# Patient Record
Sex: Male | Born: 1980 | Race: White | Hispanic: No | Marital: Married | Smoking: Former smoker
Health system: Southern US, Community
[De-identification: ages and names within clinical notes are randomized; demographics above are authoritative.]

## PROBLEM LIST (undated history)

## (undated) DIAGNOSIS — K219 Gastro-esophageal reflux disease without esophagitis: Secondary | ICD-10-CM

## (undated) DIAGNOSIS — F909 Attention-deficit hyperactivity disorder, unspecified type: Secondary | ICD-10-CM

## (undated) DIAGNOSIS — R51 Headache: Secondary | ICD-10-CM

## (undated) DIAGNOSIS — M1990 Unspecified osteoarthritis, unspecified site: Secondary | ICD-10-CM

## (undated) DIAGNOSIS — R519 Headache, unspecified: Secondary | ICD-10-CM

## (undated) DIAGNOSIS — T7840XA Allergy, unspecified, initial encounter: Secondary | ICD-10-CM

## (undated) HISTORY — DX: Headache: R51

## (undated) HISTORY — DX: Attention-deficit hyperactivity disorder, unspecified type: F90.9

## (undated) HISTORY — DX: Allergy, unspecified, initial encounter: T78.40XA

## (undated) HISTORY — DX: Unspecified osteoarthritis, unspecified site: M19.90

## (undated) HISTORY — DX: Headache, unspecified: R51.9

## (undated) HISTORY — DX: Gastro-esophageal reflux disease without esophagitis: K21.9

---

## 2000-05-01 HISTORY — PX: WISDOM TOOTH EXTRACTION: SHX21

## 2001-05-01 HISTORY — PX: COSMETIC SURGERY: SHX468

## 2002-04-29 ENCOUNTER — Ambulatory Visit (HOSPITAL_BASED_OUTPATIENT_CLINIC_OR_DEPARTMENT_OTHER): Admission: RE | Admit: 2002-04-29 | Discharge: 2002-04-29 | Payer: Self-pay | Admitting: Plastic Surgery

## 2017-11-27 ENCOUNTER — Telehealth: Payer: Self-pay | Admitting: General Practice

## 2017-11-27 NOTE — Telephone Encounter (Signed)
Please advise 

## 2017-11-27 NOTE — Telephone Encounter (Signed)
Called pt after receiving a request to establish care with our office. Pt requested to establish with Dr. Drue NovelPaz, after hearing about him through is fiance's family. Would Dr. Drue NovelPaz being willing to take on a new patient?

## 2017-11-27 NOTE — Telephone Encounter (Signed)
Yes, please schedule an appointment, please keep in mind that my schedule is tight in the next few weeks

## 2017-11-28 NOTE — Telephone Encounter (Signed)
Called pt and scheduled a new patient visit for 01/14/18.

## 2018-01-14 ENCOUNTER — Encounter: Payer: Self-pay | Admitting: Internal Medicine

## 2018-01-14 ENCOUNTER — Ambulatory Visit: Payer: BLUE CROSS/BLUE SHIELD | Admitting: Internal Medicine

## 2018-01-14 VITALS — BP 128/74 | HR 84 | Temp 97.6°F | Resp 16 | Ht 72.0 in | Wt 231.2 lb

## 2018-01-14 DIAGNOSIS — Z0000 Encounter for general adult medical examination without abnormal findings: Secondary | ICD-10-CM | POA: Diagnosis not present

## 2018-01-14 NOTE — Progress Notes (Signed)
Pre visit review using our clinic review tool, if applicable. No additional management support is needed unless otherwise documented below in the visit note. 

## 2018-01-14 NOTE — Assessment & Plan Note (Addendum)
-  Td 2010.  Recommend a flu shot -Family history: Father: MI at age 37, DM and  melanoma.  Multiple family members with depression  - Diet and exercise discussed - Discussed self skin checkup.  Avoid excessive sun exposure -Labs: CMP, CBC, TSH, FLP, testosterone level per patient request. - EKG for baseline: NSR, no old EKGs, no acute changes. RTC 1 year

## 2018-01-14 NOTE — Progress Notes (Signed)
Subjective:    Patient ID: Paul Alvarez, male    DOB: Sep 29, 1980, 37 y.o.   MRN: [id number]  DOS:  01/14/2018 Type of visit - description : CPX, new pt, here w/ his fiancee  Interval history: In general feeling well. He is somewhat concerned about his family history  Review of Systems History of allergies, they are seasonal, currently okay. History of migraines, no frequent headaches lately Occasional GERD symptoms. Occasionally decreased libido and difficulty with erections, check testosterone?  Other than above, a 14 point review of systems is negative      Past Medical History:  Diagnosis Date  . Allergy   . Arthritis   . Frequent headaches   . GERD (gastroesophageal reflux disease)     Past Surgical History:  Procedure Laterality Date  . COSMETIC SURGERY  2003   cyst removal  . WISDOM TOOTH EXTRACTION  2002    Social History   Socioeconomic History  . Marital status: Single    Spouse name: Not on file  . Number of children: 0  . Years of education: Not on file  . Highest education level: Not on file  Occupational History  . Occupation: IT /software  Social Needs  . Financial resource strain: Not on file  . Food insecurity:    Worry: Not on file    Inability: Not on file  . Transportation needs:    Medical: Not on file    Non-medical: Not on file  Tobacco Use  . Smoking status: Former Games developermoker  . Smokeless tobacco: Never Used  . Tobacco comment: quit 10/2017, no vaping   Substance and Sexual Activity  . Alcohol use: Yes    Comment: rarely  . Drug use: Not on file  . Sexual activity: Yes  Lifestyle  . Physical activity:    Days per week: Not on file    Minutes per session: Not on file  . Stress: Not on file  Relationships  . Social connections:    Talks on phone: Not on file    Gets together: Not on file    Attends religious service: Not on file    Active member of club or organization: Not on file    Attends meetings of clubs or  organizations: Not on file    Relationship status: Not on file  . Intimate partner violence:    Fear of current or ex partner: Not on file    Emotionally abused: Not on file    Physically abused: Not on file    Forced sexual activity: Not on file  Other Topics Concern  . Not on file  Social History Narrative   Got engaged 12-2017     Family History  Problem Relation Age of Onset  . Arthritis Mother   . Depression Mother   . Hyperlipidemia Mother   . Cancer Father        Melonoma  . Depression Father   . Diabetes Father   . Heart attack Father [id number]  . Heart disease Father   . Hyperlipidemia Father   . Hypertension Father   . Depression Sister   . Hypertension Sister   . Arthritis Maternal Grandmother   . Diabetes Maternal Grandmother   . Hyperlipidemia Maternal Grandmother   . Hypertension Maternal Grandmother   . Alcohol abuse Maternal Grandfather   . Cancer Maternal Grandfather   . Drug abuse Maternal Grandfather   . Early death Maternal Grandfather   . Stroke Paternal Grandmother   .  Early death Paternal Grandfather   . Heart disease Paternal Grandfather   . Heart attack Paternal Grandfather      Allergies as of 01/14/2018   No Known Allergies     Medication List    as of 01/14/2018 11:59 PM   You have not been prescribed any medications.        Objective:   Physical Exam BP 128/74 (BP Location: Left Arm, Patient Position: Sitting, Cuff Size: Normal)   Pulse 84   Temp 97.6 F (36.4 C) (Oral)   Resp 16   Ht 6' (1.829 m)   Wt 231 lb 4 oz (104.9 kg)   SpO2 96%   BMI 31.36 kg/m  General: Well developed, NAD, see BMI.  Neck: No  thyromegaly  HEENT:  Normocephalic . Face symmetric, atraumatic Lungs:  CTA B Normal respiratory effort, no intercostal retractions, no accessory muscle use. Heart: RRR,  no murmur.  No pretibial edema bilaterally  Abdomen:  Not distended, soft, non-tender. No rebound or rigidity.   Skin: Exposed areas without rash. Not  pale. Not jaundice Neurologic:  alert & oriented X3.  Speech normal, gait appropriate for age and unassisted Strength symmetric and appropriate for age.  Psych: Cognition and judgment appear intact.  Cooperative with normal attention span and concentration.  Behavior appropriate. No anxious or depressed appearing.     Assessment & Plan:   Assessment  New patient 12-2017 GERD Migraines Hayfever, allergies.  Plan: New patient. GERD: Usually dependent on his diet. Migraines: Currently controlled, no headaches in few months Allergies: Usually seasonal. The patient's fianc goes to a integrative medicine doctor, they check a number of additional labs and she wonders if I could check on him additional labs,  rec to consult w/ the integrative medicine doctor for that. RTC 1 year

## 2018-01-14 NOTE — Patient Instructions (Addendum)
  GO TO THE FRONT DESK Schedule labs to be done fasting at some point this week. Schedule your next appointment for a physical exam in 1 year     Mole A mole is a colored (pigmented) growth on the skin. Moles are very common. They are usually harmless, but some moles can become cancerous over time. What are the causes? Moles occur when pigmented skin cells grow together in clusters instead of spreading out in the skin as they normally do. The reason why the skin cells grow together in clusters is not known. What are the signs or symptoms? A mole may be:  Manson PasseyBrown or black.  Flat or raised.  Smooth or wrinkled.  How is this diagnosed? A mole is diagnosed with a skin exam. If your health care provider thinks a mole may be cancerous, a piece of the mole will be removed for testing. How is this treated? Treatment is not needed unless a mole is cancerous. If a mole is cancerous, it will be removed. If a mole is causing pain or you do not like the way it looks, you may choose to have it removed. Follow these instructions at home:  Every month, look for new moles and check your existing moles for changes. This is important because a change in a mole can mean that the mole has become cancerous. Look for changes in: ? Size. Look for moles that are more than  in (0.64 cm) wide (in diameter). ? Shape. Look for moles that are not round or oval. ? Borders. Look for moles that are not symmetrical. ? Color. Note that it is normal for moles to get darker during pregnancy or when you take birth control pills.  When you are outdoors, wear sunscreen with SPF 30 (sun protection factor 30) or higher. Reapply the sunscreen every 2-3 hours.  If you have a large number of moles, see a skin doctor (dermatologist) at least one time every year. Contact a health care provider if:  The size, shape, borders, or color of your mole change.  Your mole, or the skin near the mole, becomes painful, sore, red, or  swollen.  Your mole: ? Develops more than one color. ? Itches or bleeds. ? Becomes scaly, sheds skin, or oozes fluid. ? Becomes flat or develops raised areas. ? Becomes hard or soft.  You develop a new mole. This information is not intended to replace advice given to you by your health care provider. Make sure you discuss any questions you have with your health care provider. Document Released: 01/10/2001 Document Revised: 09/29/2015 Document Reviewed: 02/05/2015 Elsevier Interactive Patient Education  [employer]2018 [employer]

## 2018-01-15 ENCOUNTER — Encounter: Payer: Self-pay | Admitting: Internal Medicine

## 2018-01-15 DIAGNOSIS — Z09 Encounter for follow-up examination after completed treatment for conditions other than malignant neoplasm: Secondary | ICD-10-CM | POA: Insufficient documentation

## 2018-01-15 NOTE — Assessment & Plan Note (Signed)
New patient. GERD: Usually dependent on his diet. Migraines: Currently controlled, no headaches in few months Allergies: Usually seasonal. The patient's fianc goes to a integrative medicine doctor, they check a number of additional labs and she wonders if I could check on him additional labs,  rec to consult w/ the integrative medicine doctor for that. RTC 1 year

## 2018-01-18 ENCOUNTER — Other Ambulatory Visit (INDEPENDENT_AMBULATORY_CARE_PROVIDER_SITE_OTHER): Payer: BLUE CROSS/BLUE SHIELD

## 2018-01-18 DIAGNOSIS — Z0000 Encounter for general adult medical examination without abnormal findings: Secondary | ICD-10-CM

## 2018-01-18 LAB — COMPREHENSIVE METABOLIC PANEL
ALT: 21 U/L (ref 0–53)
AST: 16 U/L (ref 0–37)
Albumin: 4.5 g/dL (ref 3.5–5.2)
Alkaline Phosphatase: 81 U/L (ref 39–117)
BILIRUBIN TOTAL: 0.6 mg/dL (ref 0.2–1.2)
BUN: 14 mg/dL (ref 6–23)
CO2: 28 meq/L (ref 19–32)
CREATININE: 0.89 mg/dL (ref 0.40–1.50)
Calcium: 9.6 mg/dL (ref 8.4–10.5)
Chloride: 105 mEq/L (ref 96–112)
GFR: 102.33 mL/min (ref >60.00)
GLUCOSE: 95 mg/dL (ref 70–99)
Potassium: 4.4 mEq/L (ref 3.5–5.1)
SODIUM: 140 meq/L (ref 135–145)
Total Protein: 6.9 g/dL (ref 6.0–8.3)

## 2018-01-18 LAB — CBC WITH DIFFERENTIAL/PLATELET
Basophils Absolute: 0 10*3/uL (ref 0.0–0.1)
Basophils Relative: 0.7 % (ref 0.0–3.0)
EOS PCT: 3.6 % (ref 0.0–5.0)
Eosinophils Absolute: 0.2 10*3/uL (ref 0.0–0.7)
HCT: 44.2 % (ref 39.0–52.0)
Hemoglobin: 14.9 g/dL (ref 13.0–17.0)
LYMPHS PCT: 25.2 % (ref 12.0–46.0)
Lymphs Abs: 1.6 10*3/uL (ref 0.7–4.0)
MCHC: 33.6 g/dL (ref 30.0–36.0)
MCV: 86.7 fl (ref 78.0–100.0)
Monocytes Absolute: 0.7 10*3/uL (ref 0.1–1.0)
Monocytes Relative: 10.1 % (ref 3.0–12.0)
Neutro Abs: 3.9 10*3/uL (ref 1.4–7.7)
Neutrophils Relative %: 60.4 % (ref 43.0–77.0)
PLATELETS: 258 10*3/uL (ref 150.0–400.0)
RBC: 5.1 Mil/uL (ref 4.22–5.81)
RDW: 13.3 % (ref 11.5–15.5)
WBC: 6.4 10*3/uL (ref 4.0–10.5)

## 2018-01-18 LAB — LIPID PANEL
CHOL/HDL RATIO: 6
Cholesterol: 169 mg/dL (ref 0–200)
HDL: 26.8 mg/dL — ABNORMAL LOW (ref >39.00)
LDL Cholesterol: 116 mg/dL — ABNORMAL HIGH (ref 0–99)
NONHDL: 142.29
Triglycerides: 133 mg/dL (ref 0.0–149.0)
VLDL: 26.6 mg/dL (ref 0.0–40.0)

## 2018-01-18 LAB — TSH: TSH: 1.01 u[IU]/mL (ref 0.35–4.50)

## 2018-01-20 LAB — TESTOSTERONE,FREE AND TOTAL
Testosterone, Free: 11.4 pg/mL (ref 8.7–25.1)
Testosterone: 263 ng/dL — ABNORMAL LOW (ref 264–916)

## 2018-02-11 DIAGNOSIS — M47812 Spondylosis without myelopathy or radiculopathy, cervical region: Secondary | ICD-10-CM | POA: Diagnosis not present

## 2018-02-11 DIAGNOSIS — M542 Cervicalgia: Secondary | ICD-10-CM | POA: Diagnosis not present

## 2018-02-11 DIAGNOSIS — M9902 Segmental and somatic dysfunction of thoracic region: Secondary | ICD-10-CM | POA: Diagnosis not present

## 2018-02-11 DIAGNOSIS — M9901 Segmental and somatic dysfunction of cervical region: Secondary | ICD-10-CM | POA: Diagnosis not present

## 2018-02-14 DIAGNOSIS — M542 Cervicalgia: Secondary | ICD-10-CM | POA: Diagnosis not present

## 2018-02-14 DIAGNOSIS — M9902 Segmental and somatic dysfunction of thoracic region: Secondary | ICD-10-CM | POA: Diagnosis not present

## 2018-02-14 DIAGNOSIS — M9901 Segmental and somatic dysfunction of cervical region: Secondary | ICD-10-CM | POA: Diagnosis not present

## 2018-02-14 DIAGNOSIS — M47812 Spondylosis without myelopathy or radiculopathy, cervical region: Secondary | ICD-10-CM | POA: Diagnosis not present

## 2018-02-18 DIAGNOSIS — M542 Cervicalgia: Secondary | ICD-10-CM | POA: Diagnosis not present

## 2018-02-18 DIAGNOSIS — M9902 Segmental and somatic dysfunction of thoracic region: Secondary | ICD-10-CM | POA: Diagnosis not present

## 2018-02-18 DIAGNOSIS — M9901 Segmental and somatic dysfunction of cervical region: Secondary | ICD-10-CM | POA: Diagnosis not present

## 2018-02-18 DIAGNOSIS — M47812 Spondylosis without myelopathy or radiculopathy, cervical region: Secondary | ICD-10-CM | POA: Diagnosis not present

## 2018-02-21 DIAGNOSIS — M47812 Spondylosis without myelopathy or radiculopathy, cervical region: Secondary | ICD-10-CM | POA: Diagnosis not present

## 2018-02-21 DIAGNOSIS — M9901 Segmental and somatic dysfunction of cervical region: Secondary | ICD-10-CM | POA: Diagnosis not present

## 2018-02-21 DIAGNOSIS — M9902 Segmental and somatic dysfunction of thoracic region: Secondary | ICD-10-CM | POA: Diagnosis not present

## 2018-02-21 DIAGNOSIS — M542 Cervicalgia: Secondary | ICD-10-CM | POA: Diagnosis not present

## 2018-03-07 DIAGNOSIS — M9902 Segmental and somatic dysfunction of thoracic region: Secondary | ICD-10-CM | POA: Diagnosis not present

## 2018-03-07 DIAGNOSIS — M47812 Spondylosis without myelopathy or radiculopathy, cervical region: Secondary | ICD-10-CM | POA: Diagnosis not present

## 2018-03-07 DIAGNOSIS — M542 Cervicalgia: Secondary | ICD-10-CM | POA: Diagnosis not present

## 2018-03-07 DIAGNOSIS — M9901 Segmental and somatic dysfunction of cervical region: Secondary | ICD-10-CM | POA: Diagnosis not present

## 2018-03-21 DIAGNOSIS — M9901 Segmental and somatic dysfunction of cervical region: Secondary | ICD-10-CM | POA: Diagnosis not present

## 2018-03-21 DIAGNOSIS — M542 Cervicalgia: Secondary | ICD-10-CM | POA: Diagnosis not present

## 2018-03-21 DIAGNOSIS — M9902 Segmental and somatic dysfunction of thoracic region: Secondary | ICD-10-CM | POA: Diagnosis not present

## 2018-03-21 DIAGNOSIS — M47812 Spondylosis without myelopathy or radiculopathy, cervical region: Secondary | ICD-10-CM | POA: Diagnosis not present

## 2018-03-22 ENCOUNTER — Encounter: Payer: Self-pay | Admitting: Internal Medicine

## 2018-03-22 ENCOUNTER — Ambulatory Visit: Payer: BLUE CROSS/BLUE SHIELD | Admitting: Internal Medicine

## 2018-03-22 VITALS — BP 126/70 | HR 84 | Temp 98.3°F | Resp 16 | Ht 72.0 in | Wt 232.5 lb

## 2018-03-22 DIAGNOSIS — J069 Acute upper respiratory infection, unspecified: Secondary | ICD-10-CM

## 2018-03-22 NOTE — Progress Notes (Signed)
Subjective:    Patient ID: Paul Alvarez, male    DOB: 16-Sep-1980, 37 y.o.   MRN: [id number]  DOS:  03/22/2018 Type of visit - description : acute  Symptoms are started 5 days ago: Dry cough, mild postnasal dripping. Taking Delsym, only mild results. Few days ago, there was a leak at his house and he went to the attic, was exposed to a small amount of insulation dust.  Wonders if that is the culprit.  Review of Systems Had low-grade temperature, T-max 99.5.  No chills No sore throat No other people sick at home. Occasional wheezing?.  No rash No sneezing No itchy eyes or nose.  Past Medical History:  Diagnosis Date  . Allergy   . Arthritis   . Frequent headaches   . GERD (gastroesophageal reflux disease)     Past Surgical History:  Procedure Laterality Date  . COSMETIC SURGERY  2003   cyst removal  . WISDOM TOOTH EXTRACTION  2002    Social History   Socioeconomic History  . Marital status: Single    Spouse name: Not on file  . Number of children: 0  . Years of education: Not on file  . Highest education level: Not on file  Occupational History  . Occupation: IT /software  Social Needs  . Financial resource strain: Not on file  . Food insecurity:    Worry: Not on file    Inability: Not on file  . Transportation needs:    Medical: Not on file    Non-medical: Not on file  Tobacco Use  . Smoking status: Former Games developermoker  . Smokeless tobacco: Never Used  . Tobacco comment: quit 10/2017, no vaping   Substance and Sexual Activity  . Alcohol use: Yes    Comment: rarely  . Drug use: Not on file  . Sexual activity: Yes  Lifestyle  . Physical activity:    Days per week: Not on file    Minutes per session: Not on file  . Stress: Not on file  Relationships  . Social connections:    Talks on phone: Not on file    Gets together: Not on file    Attends religious service: Not on file    Active member of club or organization: Not on file    Attends  meetings of clubs or organizations: Not on file    Relationship status: Not on file  . Intimate partner violence:    Fear of current or ex partner: Not on file    Emotionally abused: Not on file    Physically abused: Not on file    Forced sexual activity: Not on file  Other Topics Concern  . Not on file  Social History Narrative   Got engaged 12-2017      Allergies as of 03/22/2018   No Known Allergies     Medication List    as of 03/22/2018 10:53 AM   You have not been prescribed any medications.         Objective:   Physical Exam BP 126/70 (BP Location: Left Arm, Patient Position: Sitting, Cuff Size: Normal)   Pulse 84   Temp 98.3 F (36.8 C) (Oral)   Resp 16   Ht 6' (1.829 m)   Wt 232 lb 8 oz (105.5 kg)   SpO2 98%   BMI 31.53 kg/m   General:   Well developed, NAD, BMI noted. HEENT:  Normocephalic . Face symmetric, atraumatic.  TMs are slightly bulged but not  red.  Nose congested, sinuses no TTP.  Throat symmetric and not red Lungs:  Very few rhonchi with cough only.  No wheezing. Normal respiratory effort, no intercostal retractions, no accessory muscle use. Heart: RRR,  no murmur.  No pretibial edema bilaterally  Skin: Not pale. Not jaundice Neurologic:  alert & oriented X3.  Speech normal, gait appropriate for age and unassisted Psych--  Cognition and judgment appear intact.  Cooperative with normal attention span and concentration.  Behavior appropriate. No anxious or depressed appearing.      Assessment & Plan:    Assessment  New patient 12-2017 GERD Migraines Hay fever, allergies.  Plan: URI Upper respiratory symptoms most likely due to a URI.  Recommend supportive treatment, see AVS.  Call if no better

## 2018-03-22 NOTE — Patient Instructions (Signed)
Rest, fluids , tylenol  For cough:  Take Mucinex DM twice a day as needed until better  For nasal congestion: Use   Flonase : 2 nasal sprays on each side of the nose in the morning until you feel better   Take OTC Claritin  Call if not gradually better over the next  10 days  Call anytime if the symptoms are severe

## 2018-03-22 NOTE — Progress Notes (Signed)
Pre visit review using our clinic review tool, if applicable. No additional management support is needed unless otherwise documented below in the visit note. 

## 2018-03-23 NOTE — Assessment & Plan Note (Signed)
URI Upper respiratory symptoms most likely due to a URI.  Recommend supportive treatment, see AVS.  Call if no better

## 2018-04-04 DIAGNOSIS — M9902 Segmental and somatic dysfunction of thoracic region: Secondary | ICD-10-CM | POA: Diagnosis not present

## 2018-04-04 DIAGNOSIS — M542 Cervicalgia: Secondary | ICD-10-CM | POA: Diagnosis not present

## 2018-04-04 DIAGNOSIS — M47812 Spondylosis without myelopathy or radiculopathy, cervical region: Secondary | ICD-10-CM | POA: Diagnosis not present

## 2018-04-04 DIAGNOSIS — M9901 Segmental and somatic dysfunction of cervical region: Secondary | ICD-10-CM | POA: Diagnosis not present

## 2018-04-17 DIAGNOSIS — M9901 Segmental and somatic dysfunction of cervical region: Secondary | ICD-10-CM | POA: Diagnosis not present

## 2018-04-17 DIAGNOSIS — M9902 Segmental and somatic dysfunction of thoracic region: Secondary | ICD-10-CM | POA: Diagnosis not present

## 2018-04-17 DIAGNOSIS — M47812 Spondylosis without myelopathy or radiculopathy, cervical region: Secondary | ICD-10-CM | POA: Diagnosis not present

## 2018-04-17 DIAGNOSIS — M542 Cervicalgia: Secondary | ICD-10-CM | POA: Diagnosis not present

## 2019-01-16 ENCOUNTER — Encounter: Payer: BLUE CROSS/BLUE SHIELD | Admitting: Internal Medicine

## 2019-02-11 ENCOUNTER — Encounter: Payer: BLUE CROSS/BLUE SHIELD | Admitting: Internal Medicine

## 2019-03-25 ENCOUNTER — Encounter: Payer: BLUE CROSS/BLUE SHIELD | Admitting: Internal Medicine

## 2020-07-01 ENCOUNTER — Encounter: Payer: Self-pay | Admitting: Internal Medicine

## 2020-08-20 ENCOUNTER — Encounter: Payer: Self-pay | Admitting: Internal Medicine

## 2020-10-08 ENCOUNTER — Encounter: Payer: Self-pay | Admitting: Internal Medicine

## 2020-10-08 ENCOUNTER — Ambulatory Visit (INDEPENDENT_AMBULATORY_CARE_PROVIDER_SITE_OTHER): Payer: PRIVATE HEALTH INSURANCE | Admitting: Internal Medicine

## 2020-10-08 ENCOUNTER — Other Ambulatory Visit: Payer: Self-pay

## 2020-10-08 VITALS — BP 124/86 | HR 87 | Temp 98.3°F | Resp 16 | Ht 72.0 in | Wt 255.0 lb

## 2020-10-08 DIAGNOSIS — Z0000 Encounter for general adult medical examination without abnormal findings: Secondary | ICD-10-CM | POA: Diagnosis not present

## 2020-10-08 DIAGNOSIS — Z114 Encounter for screening for human immunodeficiency virus [HIV]: Secondary | ICD-10-CM | POA: Diagnosis not present

## 2020-10-08 DIAGNOSIS — Z23 Encounter for immunization: Secondary | ICD-10-CM | POA: Diagnosis not present

## 2020-10-08 DIAGNOSIS — F32A Depression, unspecified: Secondary | ICD-10-CM | POA: Diagnosis not present

## 2020-10-08 DIAGNOSIS — Z1159 Encounter for screening for other viral diseases: Secondary | ICD-10-CM

## 2020-10-08 DIAGNOSIS — R5383 Other fatigue: Secondary | ICD-10-CM

## 2020-10-08 NOTE — Patient Instructions (Signed)
  GO TO THE LAB : Get the blood work     GO TO THE FRONT DESK, PLEASE SCHEDULE YOUR APPOINTMENTS Come back for a checkup in 4 months 

## 2020-10-08 NOTE — Progress Notes (Signed)
Subjective:    Patient ID: Paul Alvarez, male    DOB: 08-Jun-1980, 40 y.o.   MRN: [id number]  DOS:  10/08/2020 Type of visit - description: CPX  Here for CPX Has not been seen in 3 years. Since then he got married, has 2 stepchildren, changed jobs.  Admit to stress. Has noted significant weight gain. Due to stress is feeling anxious and somewhat depressed.  Denies suicidal or homicidal ideas. 6 weeks ago started to talk with a therapist and that helped.  He also reports some decreased energy compared to a couple of years ago, nothing severe.. When asked, reports that he snores mildly for years.  Wt Readings from Last 3 Encounters:  10/08/20 255 lb (115.7 kg)  03/22/18 232 lb 8 oz (105.5 kg)  01/14/18 231 lb 4 oz (104.9 kg)     Review of Systems  Other than above, a 14 point review of systems is negative     Past Medical History:  Diagnosis Date   Allergy    Arthritis    Frequent headaches    GERD (gastroesophageal reflux disease)     Past Surgical History:  Procedure Laterality Date   COSMETIC SURGERY  2003   cyst removal   WISDOM TOOTH EXTRACTION  2002   Social History   Socioeconomic History   Marital status: Married    Spouse name: Not on file   Number of children: 0   Years of education: Not on file   Highest education level: Not on file  Occupational History   Occupation: commercial health insurance  Tobacco Use   Smoking status: Former    Pack years: 0.00   Smokeless tobacco: Never   Tobacco comments:    quit 10/2017, no vaping   Substance and Sexual Activity   Alcohol use: Yes    Comment: rarely   Drug use: Not on file   Sexual activity: Yes  Other Topics Concern   Not on file  Social History Narrative   Married 2019   2 step children   Social Determinants of Health   Financial Resource Strain: Not on file  Food Insecurity: Not on file  Transportation Needs: Not on file  Physical Activity: Not on file  Stress: Not on  file  Social Connections: Not on file  Intimate Partner Violence: Not on file    Allergies as of 10/08/2020   No Known Allergies      Medication List    as of October 08, 2020 11:59 PM   You have not been prescribed any medications.        Objective:   Physical Exam BP 124/86 (BP Location: Left Arm, Patient Position: Sitting, Cuff Size: Normal)   Pulse 87   Temp 98.3 F (36.8 C) (Oral)   Resp 16   Ht 6' (1.829 m)   Wt 255 lb (115.7 kg)   SpO2 97%   BMI 34.58 kg/m  General: Well developed, NAD, BMI noted Neck: No  thyromegaly  HEENT:  Normocephalic . Face symmetric, atraumatic Lungs:  CTA B Normal respiratory effort, no intercostal retractions, no accessory muscle use. Heart: RRR,  no murmur.  Abdomen:  Not distended, soft, non-tender. No rebound or rigidity.   Lower extremities: Trace pretibial edema bilaterally  Skin: Exposed areas without rash. Not pale. Not jaundice Neurologic:  alert & oriented X3.  Speech normal, gait appropriate for age and unassisted Strength symmetric and appropriate for age.  Psych: Cognition and judgment appear intact.  Cooperative with  normal attention span and concentration.  Behavior appropriate. No anxious or depressed appearing.     Assessment     Assessment  New patient 12-2017 GERD Migraines Hay fever, allergies. Covid infex 10/2019, rx conservatively     PLAN Here for CPX Anxiety depression: Due to stress, family and work-related, started to see a therapist recently that is helpful. PHQ-9 today: 9. D/w pt medicines, he is not ready, will call if needed. Weight gain: Extensive discussion about diet and exercise Fatigue: Epworth scale 7, average.  Reassess in 4 months.  Checking labs. RTC 4 months     This visit occurred during the SARS-CoV-2 public health emergency.  Safety protocols were in place, including screening questions prior to the visit, additional usage of staff PPE, and extensive cleaning of exam room  while observing appropriate contact time as indicated for disinfecting solutions.

## 2020-10-09 LAB — COMPREHENSIVE METABOLIC PANEL
ALT: 28 IU/L (ref 0–44)
AST: 23 IU/L (ref 0–40)
Albumin/Globulin Ratio: 2 (ref 1.2–2.2)
Albumin: 5.1 g/dL — ABNORMAL HIGH (ref 4.0–5.0)
Alkaline Phosphatase: 102 IU/L (ref 44–121)
BUN/Creatinine Ratio: 14 (ref 9–20)
BUN: 14 mg/dL (ref 6–20)
Bilirubin Total: 0.4 mg/dL (ref 0.0–1.2)
CO2: 25 mmol/L (ref 20–29)
Calcium: 10 mg/dL (ref 8.7–10.2)
Chloride: 101 mmol/L (ref 96–106)
Creatinine, Ser: 0.99 mg/dL (ref 0.76–1.27)
Globulin, Total: 2.6 g/dL (ref 1.5–4.5)
Glucose: 88 mg/dL (ref 65–99)
Potassium: 4.4 mmol/L (ref 3.5–5.2)
Sodium: 141 mmol/L (ref 134–144)
Total Protein: 7.7 g/dL (ref 6.0–8.5)
eGFR: 74 mL/min/{1.73_m2} (ref >59)

## 2020-10-09 LAB — LIPID PANEL
Chol/HDL Ratio: 5.2 ratio — ABNORMAL HIGH (ref 0.0–5.0)
Cholesterol, Total: 194 mg/dL (ref 100–199)
HDL: 37 mg/dL — ABNORMAL LOW (ref >39)
LDL Chol Calc (NIH): 128 mg/dL — ABNORMAL HIGH (ref 0–99)
Triglycerides: 159 mg/dL — ABNORMAL HIGH (ref 0–149)
VLDL Cholesterol Cal: 29 mg/dL (ref 5–40)

## 2020-10-09 LAB — HIV ANTIBODY (ROUTINE TESTING W REFLEX): HIV Screen 4th Generation wRfx: NONREACTIVE

## 2020-10-09 LAB — CBC WITH DIFFERENTIAL/PLATELET
Basophils Absolute: 0.1 10*3/uL (ref 0.0–0.2)
Basos: 1 %
EOS (ABSOLUTE): 0.2 10*3/uL (ref 0.0–0.4)
Eos: 3 %
Hematocrit: 47.1 % (ref 37.5–51.0)
Hemoglobin: 15.6 g/dL (ref 13.0–17.7)
Immature Grans (Abs): 0.1 10*3/uL (ref 0.0–0.1)
Immature Granulocytes: 1 %
Lymphocytes Absolute: 2 10*3/uL (ref 0.7–3.1)
Lymphs: 21 %
MCH: 28.9 pg (ref 26.6–33.0)
MCHC: 33.1 g/dL (ref 31.5–35.7)
MCV: 87 fL (ref 79–97)
Monocytes Absolute: 0.9 10*3/uL (ref 0.1–0.9)
Monocytes: 9 %
Neutrophils Absolute: 6.3 10*3/uL (ref 1.4–7.0)
Neutrophils: 65 %
Platelets: 276 10*3/uL (ref 150–450)
RBC: 5.4 x10E6/uL (ref 4.14–5.80)
RDW: 13.1 % (ref 11.6–15.4)
WBC: 9.6 10*3/uL (ref 3.4–10.8)

## 2020-10-09 LAB — HEPATITIS C ANTIBODY: Hep C Virus Ab: <0.1 s/co ratio (ref 0.0–0.9)

## 2020-10-09 LAB — HEMOGLOBIN A1C
Est. average glucose Bld gHb Est-mCnc: 114 mg/dL
Hgb A1c MFr Bld: 5.6 % (ref 4.8–5.6)

## 2020-10-09 LAB — B12 AND FOLATE PANEL
Folate: 14.8 ng/mL (ref >3.0)
Vitamin B-12: 448 pg/mL (ref 232–1245)

## 2020-10-09 LAB — TSH: TSH: 1.34 u[IU]/mL (ref 0.450–4.500)

## 2020-10-09 LAB — VITAMIN D 25 HYDROXY (VIT D DEFICIENCY, FRACTURES): Vit D, 25-Hydroxy: 34.5 ng/mL (ref 30.0–100.0)

## 2020-10-10 ENCOUNTER — Encounter: Payer: Self-pay | Admitting: Internal Medicine

## 2020-10-10 NOTE — Assessment & Plan Note (Signed)
-  Tdap: Today -COVID vaccines pro-cons d/w pt, declines  - FH   F  MI at age 40, DM and  melanoma.  Multiple family members with depression - Diet and exercise : Extensive discussion -Labs: HIV, hep C, CMP, FLP, CBC, TSH, A1c, vitamin D B12

## 2020-10-10 NOTE — Assessment & Plan Note (Signed)
Here for CPX Anxiety depression: Due to stress, family and work-related, started to see a therapist recently that is helpful. PHQ-9 today: 9. D/w pt medicines, he is not ready, will call if needed. Weight gain: Extensive discussion about diet and exercise Fatigue: Epworth scale 7, average.  Reassess in 4 months.  Checking labs. RTC 4 months

## 2020-12-30 ENCOUNTER — Other Ambulatory Visit: Payer: Self-pay

## 2020-12-30 ENCOUNTER — Ambulatory Visit (INDEPENDENT_AMBULATORY_CARE_PROVIDER_SITE_OTHER): Payer: PRIVATE HEALTH INSURANCE | Admitting: Internal Medicine

## 2020-12-30 ENCOUNTER — Encounter: Payer: Self-pay | Admitting: Internal Medicine

## 2020-12-30 VITALS — BP 122/80 | HR 79 | Temp 98.1°F | Resp 16 | Ht 72.0 in | Wt 255.5 lb

## 2020-12-30 DIAGNOSIS — F419 Anxiety disorder, unspecified: Secondary | ICD-10-CM | POA: Diagnosis not present

## 2020-12-30 DIAGNOSIS — F32A Depression, unspecified: Secondary | ICD-10-CM

## 2020-12-30 MED ORDER — FLUOXETINE HCL 10 MG PO CAPS: ORAL_CAPSULE | ORAL | 3 refills | Status: DC

## 2020-12-30 NOTE — Progress Notes (Signed)
   Subjective:    Patient ID: Paul Alvarez, male    DOB: 20-Aug-1980, 40 y.o.   MRN: [id number]  DOS:  12/30/2020 Type of visit - description: Follow-up    Review of Systems See above   Past Medical History:  Diagnosis Date   Allergy    Arthritis    Frequent headaches    GERD (gastroesophageal reflux disease)     Past Surgical History:  Procedure Laterality Date   COSMETIC SURGERY  2003   cyst removal   WISDOM TOOTH EXTRACTION  2002    Allergies as of 12/30/2020   No Known Allergies      Medication List    as of December 30, 2020  8:52 AM   You have not been prescribed any medications.        Objective:   Physical Exam BP 122/80 (BP Location: Left Arm, Patient Position: Sitting, Cuff Size: Normal)   Pulse 79   Temp 98.1 F (36.7 C) (Oral)   Resp 16   Ht 6' (1.829 m)   Wt 255 lb 8 oz (115.9 kg)   SpO2 98%   BMI 34.65 kg/m      Assessment     Assessment  New patient 12-2017 GERD Migraines Hay fever, allergies. Covid infex 10/2019, rx conservatively     PLAN Here for CPX Anxiety depression: Due to stress, family and work-related, started to see a therapist recently that is helpful. PHQ-9 today: 9. D/w pt medicines, he is not ready, will call if needed. Weight gain: Extensive discussion about diet and exercise Fatigue: Epworth scale 7, average.  Reassess in 4 months.  Checking labs. RTC 4 months    This visit occurred during the SARS-CoV-2 public health emergency.  Safety protocols were in place, including screening questions prior to the visit, additional usage of staff PPE, and extensive cleaning of exam room while observing appropriate contact time as indicated for disinfecting solutions.

## 2020-12-30 NOTE — Progress Notes (Signed)
   Subjective:    Patient ID: Paul Alvarez, male    DOB: [birth year], 40 y.o.   MRN: [id number]  DOS:  12/30/2020 Type of visit - description: Follow-up The patient scheduled this early follow-up at the request of his wife. She has noted that he is under a lot of stress, his mood varies throughout the day. The patient agrees, he has feel very anxious and that has created some degree of depression. Denies any suicidal ideas. He has also noted decreased capacity on to concentrate on things.  His job is very busy.  He is sleeps well most of the time. Has been told he occasionally snores. Denies any morning headaches. Never formally diagnosed with ADD but at some point he felt he did have ADD   Review of Systems See above   Past Medical History:  Diagnosis Date   Allergy    Arthritis    Frequent headaches    GERD (gastroesophageal reflux disease)     Past Surgical History:  Procedure Laterality Date   COSMETIC SURGERY  2003   cyst removal   WISDOM TOOTH EXTRACTION  2002    Allergies as of 12/30/2020   No Known Allergies      Medication List    as of December 30, 2020  9:01 AM   You have not been prescribed any medications.        Objective:   Physical Exam BP 122/80 (BP Location: Left Arm, Patient Position: Sitting, Cuff Size: Normal)   Pulse 79   Temp 98.1 F (36.7 C) (Oral)   Resp 16   Ht 6' (1.829 m)   Wt 255 lb 8 oz (115.9 kg)   SpO2 98%   BMI 34.65 kg/m  General:   Well developed, NAD, BMI noted. HEENT:  Normocephalic . Face symmetric, atraumatic  Skin: Not pale. Not jaundice Neurologic:  alert & oriented X3.  Speech normal, gait appropriate for age and unassisted Psych--  Cognition and judgment appear intact.  Cooperative with normal attention span and concentration.  Behavior appropriate. No anxious or depressed appearing.      Assessment     Assessment  New patient 12-2017 GERD Migraines Hay fever, allergies. Covid infex  10/2019, rx conservatively   PLAN Anxiety, depression: A lot of stress, work-related.  This is increasing his anxiety and consequently somewhat depressed.  PHQ-9 today 16 (previously 9) His capacity to concentrate has decreased, never formally diagnosed with ADD. I inquired about OSA symptoms, Epworth 11 (elevated)  Plan: Sees a counselor, rec to continue Start fluoxetine 20 mg, reassess in 6 weeks, increase to 40?Marland Kitchen Reassess ADD symptoms after anxiety and depression controlled OSA? Some evidence of OSA (which could mimic ADD type of sxs and worsen anxiety)  We discussed observation for now versus further testing, we agreed to revisit the issue when he comes back in 6 weeks. RTC 6 weeks  This visit occurred during the SARS-CoV-2 public health emergency.  Safety protocols were in place, including screening questions prior to the visit, additional usage of staff PPE, and extensive cleaning of exam room while observing appropriate contact time as indicated for disinfecting solutions.

## 2020-12-30 NOTE — Assessment & Plan Note (Addendum)
Anxiety, depression: A lot of stress, work-related.  This is increasing his anxiety and consequently somewhat depressed.  PHQ-9 today 16 (previously 9) His capacity to concentrate has decreased, never formally diagnosed with ADD. I inquired about OSA symptoms, Epworth 11 (elevated)  Plan: Sees a counselor, rec to continue Start fluoxetine 20 mg, reassess in 6 weeks, increase to 40?Marland Kitchen Reassess ADD symptoms after anxiety and depression controlled OSA? Some evidence of OSA (which could mimic ADD type of sxs and worsen anxiety)  We discussed observation for now versus further testing, we agreed to revisit the issue when he comes back in 6 weeks. RTC 6 weeks

## 2020-12-30 NOTE — Patient Instructions (Signed)
Continue with your therapist  Start fluoxetine 10 mg capsule: 1 a day for 10 days, then 2 tablets daily.  Exercise regularly  Schedule a visit in 6 weeks.

## 2021-02-11 ENCOUNTER — Ambulatory Visit: Payer: PRIVATE HEALTH INSURANCE | Admitting: Internal Medicine

## 2021-02-18 ENCOUNTER — Other Ambulatory Visit: Payer: Self-pay

## 2021-02-18 ENCOUNTER — Encounter: Payer: Self-pay | Admitting: Internal Medicine

## 2021-02-18 ENCOUNTER — Ambulatory Visit (INDEPENDENT_AMBULATORY_CARE_PROVIDER_SITE_OTHER): Payer: PRIVATE HEALTH INSURANCE | Admitting: Internal Medicine

## 2021-02-18 VITALS — BP 122/84 | HR 88 | Temp 98.1°F | Resp 16 | Ht 72.0 in | Wt 255.5 lb

## 2021-02-18 DIAGNOSIS — F32A Depression, unspecified: Secondary | ICD-10-CM

## 2021-02-18 DIAGNOSIS — F419 Anxiety disorder, unspecified: Secondary | ICD-10-CM

## 2021-02-18 MED ORDER — FLUOXETINE HCL 20 MG PO TABS: 20.0000 mg | ORAL_TABLET | Freq: Every day | ORAL | 6 refills | Status: DC

## 2021-02-18 NOTE — Progress Notes (Signed)
   Subjective:    Patient ID: Paul Alvarez, male    DOB: 12-24-1980, 40 y.o.   MRN: [id number]  DOS:  02/18/2021 Type of visit - description: Follow-up  Today with talk about anxiety, depression, possible ADD and OSA. Started fluoxetine at the last visit today in the last 2 to 3 weeks he has definitely seen an improvement.  He is much less sleepy, only has occasional snoring, no suicidal ideas  Review of Systems See above   Past Medical History:  Diagnosis Date   Allergy    Arthritis    Frequent headaches    GERD (gastroesophageal reflux disease)     Past Surgical History:  Procedure Laterality Date   COSMETIC SURGERY  2003   cyst removal   WISDOM TOOTH EXTRACTION  2002    Allergies as of 02/18/2021   No Known Allergies      Medication List        Accurate as of February 18, 2021 11:59 PM. If you have any questions, ask your nurse or doctor.          STOP taking these medications    FLUoxetine 10 MG capsule Commonly known as: PROZAC Replaced by: FLUoxetine 20 MG tablet Stopped by: Willow Ora, MD       TAKE these medications    FLUoxetine 20 MG tablet Commonly known as: PROZAC Take 1 tablet (20 mg total) by mouth daily. Replaces: FLUoxetine 10 MG capsule Started by: Willow Ora, MD           Objective:   Physical Exam BP 122/84 (BP Location: Left Arm, Patient Position: Sitting, Cuff Size: Normal)   Pulse 88   Temp 98.1 F (36.7 C) (Oral)   Resp 16   Ht 6' (1.829 m)   Wt 255 lb 8 oz (115.9 kg)   SpO2 98%   BMI 34.65 kg/m  General:   Well developed, NAD, BMI noted. HEENT:  Normocephalic . Face symmetric, atraumatic Skin: Not pale. Not jaundice Neurologic:  alert & oriented X3.  Speech normal, gait appropriate for age and unassisted Psych--  Cognition and judgment appear intact.  Cooperative with normal attention span and concentration.  Behavior appropriate. No anxious or depressed appearing.      Assessment        Assessment  New patient 12-2017 GERD Migraines Hay fever, allergies. Covid infex 10/2019, rx conservatively   PLAN Anxiety, depression: Since the last visit, started fluoxetine, symptoms definitely improved, no suicidal ideas. Had some symptoms of ADD, states his work environment  is busy and chaotic, he still feels distracted at times but overall things are better.  He does not like to try any ADD medication at this point.  Advised that if his therapist formally dx ADD I would be willing to treat w/ meds. OSA?  Denies feeling sleepy, previous Epworth scale was 11, today:4.  Better, we will continue monitoring. Preventive care: Declined flu shot. RTC 6 months   This visit occurred during the SARS-CoV-2 public health emergency.  Safety protocols were in place, including screening questions prior to the visit, additional usage of staff PPE, and extensive cleaning of exam room while observing appropriate contact time as indicated for disinfecting solutions.

## 2021-02-18 NOTE — Patient Instructions (Signed)
   GO TO THE FRONT DESK, PLEASE SCHEDULE YOUR APPOINTMENTS Come back for  a check up in 6 months  

## 2021-02-20 NOTE — Assessment & Plan Note (Signed)
Anxiety, depression: Since the last visit, started fluoxetine, symptoms definitely improved, no suicidal ideas. Had some symptoms of ADD, states his work environment  is busy and chaotic, he still feels distracted at times but overall things are better.  He does not like to try any ADD medication at this point.  Advised that if his therapist formally dx ADD I would be willing to treat w/ meds. OSA?  Denies feeling sleepy, previous Epworth scale was 11, today:4.  Better, we will continue monitoring. Preventive care: Declined flu shot. RTC 6 months

## 2021-05-27 ENCOUNTER — Other Ambulatory Visit: Payer: Self-pay

## 2021-05-27 ENCOUNTER — Emergency Department (HOSPITAL_BASED_OUTPATIENT_CLINIC_OR_DEPARTMENT_OTHER): Payer: PRIVATE HEALTH INSURANCE

## 2021-05-27 ENCOUNTER — Emergency Department (HOSPITAL_BASED_OUTPATIENT_CLINIC_OR_DEPARTMENT_OTHER)
Admission: EM | Admit: 2021-05-27 | Discharge: 2021-05-27 | Disposition: A | Discharge status: Home / Self Care | Payer: PRIVATE HEALTH INSURANCE | Attending: Emergency Medicine | Admitting: Emergency Medicine

## 2021-05-27 ENCOUNTER — Encounter (HOSPITAL_BASED_OUTPATIENT_CLINIC_OR_DEPARTMENT_OTHER): Payer: Self-pay | Admitting: Emergency Medicine

## 2021-05-27 DIAGNOSIS — M25562 Pain in left knee: Secondary | ICD-10-CM | POA: Insufficient documentation

## 2021-05-27 DIAGNOSIS — S0081XA Abrasion of other part of head, initial encounter: Secondary | ICD-10-CM | POA: Insufficient documentation

## 2021-05-27 DIAGNOSIS — S0990XA Unspecified injury of head, initial encounter: Secondary | ICD-10-CM | POA: Diagnosis present

## 2021-05-27 DIAGNOSIS — Z79899 Other long term (current) drug therapy: Secondary | ICD-10-CM | POA: Insufficient documentation

## 2021-05-27 DIAGNOSIS — M436 Torticollis: Secondary | ICD-10-CM

## 2021-05-27 DIAGNOSIS — M25619 Stiffness of unspecified shoulder, not elsewhere classified: Secondary | ICD-10-CM | POA: Insufficient documentation

## 2021-05-27 DIAGNOSIS — Y92410 Unspecified street and highway as the place of occurrence of the external cause: Secondary | ICD-10-CM | POA: Diagnosis not present

## 2021-05-27 MED ORDER — METHOCARBAMOL 500 MG PO TABS: 500.0000 mg | ORAL_TABLET | Freq: Every evening | ORAL | 0 refills | Status: DC | PRN
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
## 2021-05-27 NOTE — Discharge Instructions (Addendum)
Take ibuprofen 3 times a day with meals.  Do not take other anti-inflammatories at the same time (Advil, Motrin, naproxen, Aleve). You may supplement with Tylenol if you need further pain control. °Use robaxin as needed for muscle stiffness or soreness.  Have caution, this may make you tired or groggy.  Do not drive or operate heavy machinery while taking this medicine. °Use ice packs or heating pads if this helps control your pain. °You will likely have continued muscle stiffness and soreness over the next couple days.  Follow-up with primary care in 1 week if your symptoms are not improving. °Return to the emergency room if you develop vision changes, vomiting, slurred speech, numbness, loss of bowel or bladder control, or any new or worsening symptoms. ° °

## 2021-05-27 NOTE — ED Notes (Signed)
Patient discharged to home.  All discharge instructions reviewed.  Patient verbalized understanding via teachback method.  VS WDL.  Respirations even and unlabored.  Ambulatory out of ED.   °

## 2021-06-03 ENCOUNTER — Ambulatory Visit (INDEPENDENT_AMBULATORY_CARE_PROVIDER_SITE_OTHER): Payer: PRIVATE HEALTH INSURANCE | Admitting: Family

## 2021-06-03 VITALS — BP 130/80 | HR 108 | Temp 98.2°F | Ht 71.0 in | Wt 264.2 lb

## 2021-06-03 DIAGNOSIS — M25511 Pain in right shoulder: Secondary | ICD-10-CM

## 2021-06-03 DIAGNOSIS — M542 Cervicalgia: Secondary | ICD-10-CM

## 2021-06-03 DIAGNOSIS — R519 Headache, unspecified: Secondary | ICD-10-CM

## 2021-06-03 DIAGNOSIS — T148XXA Other injury of unspecified body region, initial encounter: Secondary | ICD-10-CM | POA: Diagnosis not present

## 2021-06-03 MED ORDER — MUPIROCIN 2 % EX OINT: 1.0000 "application " | TOPICAL_OINTMENT | Freq: Two times a day (BID) | CUTANEOUS | 0 refills | Status: DC

## 2021-06-03 MED ORDER — TIZANIDINE HCL 2 MG PO CAPS: 2.0000 mg | ORAL_CAPSULE | Freq: Three times a day (TID) | ORAL | 0 refills | Status: DC | PRN

## 2021-06-03 MED ORDER — MELOXICAM 15 MG PO TABS: 15.0000 mg | ORAL_TABLET | Freq: Every day | ORAL | 0 refills | Status: DC

## 2021-06-03 NOTE — Patient Instructions (Addendum)
[facility] [address]  Please call back if you need a referral but I would agree that you do need to see sports medicine or orthopedics
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
## 2021-06-06 ENCOUNTER — Ambulatory Visit (INDEPENDENT_AMBULATORY_CARE_PROVIDER_SITE_OTHER): Payer: PRIVATE HEALTH INSURANCE | Admitting: Family Medicine

## 2021-06-06 ENCOUNTER — Encounter: Payer: Self-pay | Admitting: Family Medicine

## 2021-06-06 ENCOUNTER — Other Ambulatory Visit: Payer: Self-pay

## 2021-06-06 VITALS — BP 130/80 | HR 107 | Ht 71.0 in | Wt 263.8 lb

## 2021-06-06 DIAGNOSIS — F419 Anxiety disorder, unspecified: Secondary | ICD-10-CM

## 2021-06-06 DIAGNOSIS — M542 Cervicalgia: Secondary | ICD-10-CM

## 2021-06-06 DIAGNOSIS — M25511 Pain in right shoulder: Secondary | ICD-10-CM

## 2021-06-06 DIAGNOSIS — S060X0A Concussion without loss of consciousness, initial encounter: Secondary | ICD-10-CM

## 2021-06-06 DIAGNOSIS — F32A Depression, unspecified: Secondary | ICD-10-CM

## 2021-06-06 DIAGNOSIS — G43709 Chronic migraine without aura, not intractable, without status migrainosus: Secondary | ICD-10-CM

## 2021-06-06 DIAGNOSIS — S060X9A Concussion with loss of consciousness of unspecified duration, initial encounter: Secondary | ICD-10-CM

## 2021-06-06 DIAGNOSIS — M546 Pain in thoracic spine: Secondary | ICD-10-CM
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
## 2021-06-06 NOTE — Patient Instructions (Addendum)
Nice to meet you today.  I've referred you to Physical Therapy.  Their office will call you to schedule but please let us know if you haven't heard from them in one week regarding scheduling.  Follow-up: 2 weeks.

## 2021-06-07 DIAGNOSIS — F419 Anxiety disorder, unspecified: Secondary | ICD-10-CM | POA: Insufficient documentation

## 2021-06-07 DIAGNOSIS — F32A Depression, unspecified: Secondary | ICD-10-CM | POA: Insufficient documentation

## 2021-06-07 DIAGNOSIS — S060X0A Concussion without loss of consciousness, initial encounter: Secondary | ICD-10-CM | POA: Insufficient documentation

## 2021-06-07 DIAGNOSIS — G43909 Migraine, unspecified, not intractable, without status migrainosus: Secondary | ICD-10-CM | POA: Insufficient documentation

## 2021-06-10 ENCOUNTER — Ambulatory Visit: Payer: PRIVATE HEALTH INSURANCE | Admitting: Internal Medicine

## 2021-06-17 ENCOUNTER — Ambulatory Visit (INDEPENDENT_AMBULATORY_CARE_PROVIDER_SITE_OTHER): Payer: PRIVATE HEALTH INSURANCE | Admitting: Family Medicine

## 2021-06-17 ENCOUNTER — Other Ambulatory Visit: Payer: Self-pay

## 2021-06-17 VITALS — BP 152/102 | HR 111 | Ht 71.0 in | Wt 261.0 lb

## 2021-06-17 DIAGNOSIS — F32A Depression, unspecified: Secondary | ICD-10-CM

## 2021-06-17 DIAGNOSIS — F419 Anxiety disorder, unspecified: Secondary | ICD-10-CM | POA: Diagnosis not present

## 2021-06-17 DIAGNOSIS — M542 Cervicalgia: Secondary | ICD-10-CM

## 2021-06-17 DIAGNOSIS — S060X0D Concussion without loss of consciousness, subsequent encounter: Secondary | ICD-10-CM

## 2021-06-17 DIAGNOSIS — M25511 Pain in right shoulder: Secondary | ICD-10-CM | POA: Diagnosis not present

## 2021-06-17 MED ORDER — FLUOXETINE HCL 40 MG PO CAPS: 40.0000 mg | ORAL_CAPSULE | Freq: Every day | ORAL | 1 refills | Status: DC

## 2021-06-17 NOTE — Patient Instructions (Signed)
Thank you for coming in today.   Increase prozac to 40 mg daily.   Check with me in 3 weeks.   Ok to return to work as scheduled on Feb 21st.   Let me know if you have a problem.
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
## 2021-06-29 ENCOUNTER — Encounter: Payer: Self-pay | Admitting: Physical Therapy

## 2021-06-29 ENCOUNTER — Other Ambulatory Visit: Payer: Self-pay

## 2021-06-29 ENCOUNTER — Ambulatory Visit: Payer: PRIVATE HEALTH INSURANCE | Attending: Family Medicine | Admitting: Physical Therapy

## 2021-06-29 DIAGNOSIS — S060X9A Concussion with loss of consciousness of unspecified duration, initial encounter: Secondary | ICD-10-CM | POA: Insufficient documentation

## 2021-06-29 DIAGNOSIS — R252 Cramp and spasm: Secondary | ICD-10-CM | POA: Diagnosis present

## 2021-06-29 DIAGNOSIS — M542 Cervicalgia: Secondary | ICD-10-CM | POA: Diagnosis not present

## 2021-06-29 DIAGNOSIS — M546 Pain in thoracic spine: Secondary | ICD-10-CM | POA: Insufficient documentation

## 2021-06-29 DIAGNOSIS — M25511 Pain in right shoulder: Secondary | ICD-10-CM | POA: Diagnosis present

## 2021-06-29 NOTE — Patient Instructions (Signed)
Access Code: [email or web address] ?URL: [email or web address] ?Date: 06/29/2021 ?Prepared by: Harrie Foreman ? ?Exercises ?Seated Cervical Retraction - 5 x daily - 7 x weekly - 1 sets - 5 reps - 5 sec hold ?Seated Shoulder Rolls - 5 x daily - 7 x weekly - 1 sets - 5 reps ?Seated Scapular Retraction - 5 x daily - 7 x weekly - 1 sets - 5 reps - 5 sec hold ?Seated Levator Scapulae Stretch - 3 x daily - 7 x weekly - 1 sets - 3 reps - 15sec hold ? ?
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
## 2021-07-01 ENCOUNTER — Other Ambulatory Visit: Payer: Self-pay

## 2021-07-01 ENCOUNTER — Ambulatory Visit: Payer: PRIVATE HEALTH INSURANCE | Admitting: Physical Therapy

## 2021-07-01 ENCOUNTER — Other Ambulatory Visit: Payer: Self-pay | Admitting: Family

## 2021-07-01 ENCOUNTER — Encounter: Payer: Self-pay | Admitting: Physical Therapy

## 2021-07-01 DIAGNOSIS — M542 Cervicalgia: Secondary | ICD-10-CM | POA: Diagnosis not present

## 2021-07-01 DIAGNOSIS — M25511 Pain in right shoulder: Secondary | ICD-10-CM

## 2021-07-01 DIAGNOSIS — R252 Cramp and spasm: Secondary | ICD-10-CM

## 2021-07-01 NOTE — Patient Instructions (Signed)
Trigger Point Dry Needling ? ?What is Trigger Point Dry Needling (DN)? ?DN is a physical therapy technique used to treat muscle pain and dysfunction. Specifically, DN helps deactivate muscle trigger points (muscle knots).  ?A thin filiform needle is used to penetrate the skin and stimulate the underlying trigger point. The goal is for a local twitch response (LTR) to occur and for the trigger point to relax. No medication of any kind is injected during the procedure.  ? ?What Does Trigger Point Dry Needling Feel Like?  ?The procedure feels different for each individual patient. Some patients report that they do not actually feel the needle enter the skin and overall the process is not painful. Very mild bleeding may occur. However, many patients feel a deep cramping in the muscle in which the needle was inserted. This is the local twitch response.  ? ?How Will I feel after the treatment? ?Soreness is normal, and the onset of soreness may not occur for a few hours. Typically this soreness does not last longer than two days.  ?Bruising is uncommon, however; ice can be used to decrease any possible bruising.  ?In rare cases feeling tired or nauseous after the treatment is normal. In addition, your symptoms may get worse before they get better, this period will typically not last longer than 24 hours.  ? ?What Can I do After My Treatment? ?Increase your hydration by drinking more water for the next 24 hours. ?You may place ice or heat on the areas treated that have become sore, however, do not use heat on inflamed or bruised areas. Heat often brings more relief post needling. ?You can continue your regular activities, but vigorous activity is not recommended initially after the treatment for 24 hours. ?DN is best combined with other physical therapy such as strengthening, stretching, and other therapies.  ? ? ?Access Code: [email or web address] ?URL: [email or web address] ?Date: 07/01/2021 ?Prepared by: Glenetta Hew ? ?Exercises ?Seated Cervical Retraction - 5 x daily - 7 x weekly - 1 sets - 5 reps - 5 sec hold ?Seated Shoulder Rolls - 5 x daily - 7 x weekly - 1 sets - 5 reps ?Seated Scapular Retraction - 5 x daily - 7 x weekly - 1 sets - 5 reps - 5 sec hold ?Seated Levator Scapulae Stretch - 3 x daily - 7 x weekly - 1 sets - 3 reps - 15sec hold ?Shoulder extension with resistance - Neutral - 1 x daily - 7 x weekly - 3 sets - 10 reps ?Scapular Retraction with Resistance - 1 x daily - 7 x weekly - 3 sets - 10 reps ?Shoulder W - External Rotation with Resistance - 1 x daily - 7 x weekly - 3 sets - 10 reps ?Cervical Extension AROM with Strap - 1 x daily - 7 x weekly - 1 sets - 10 reps ?Mid-Lower Cervical Extension SNAG with Strap - 1 x daily - 7 x weekly - 1 sets - 10 reps ?Seated Assisted Cervical Rotation with Towel - 1 x daily - 7 x weekly - 1 sets - 10 reps ? ?

## 2021-07-01 NOTE — Therapy (Signed)
[facility] ?[address]  [address] ?[place], [place], [address] ?Phone: [phone]   Fax:  [phone] ? ?Physical Therapy Treatment ? ?Patient Details  ?Name: Paul Alvarez [facility] ?MRN: [id number] ?Date of Birth: 07-17-1980 ?Referring Provider (PT): [name] MD ? ? ?Encounter Date: 07/01/2021 ? ? PT End of Session - 07/01/21 0845   ? ? Visit Number 2   ? Number of Visits 12   ? Date for PT Re-Evaluation 08/10/21   ? PT Start Time 0845   ? PT Stop Time 0930   ? PT Time Calculation (min) 45 min   ? Activity Tolerance Patient tolerated treatment well   ? Behavior During Therapy [facility] for tasks assessed/performed   ? ?  ?  ? ?  ? ? ?Past Medical History:  ?Diagnosis Date  ? Allergy   ? Arthritis   ? Frequent headaches   ? GERD (gastroesophageal reflux disease)   ? ? ?Past Surgical History:  ?Procedure Laterality Date  ? COSMETIC SURGERY  2003  ? cyst removal  ? WISDOM TOOTH EXTRACTION  2002  ? ? ?There were no vitals filed for this visit. ? ? Subjective Assessment - 07/01/21 0846   ? ? Subjective Pt. reports his neck felt better yesterday after evaluation where worked on it.  He has tried to perform HEP consistently but was pretty busy - did it 3-4 times.   ? Pertinent History migraines   ? How long can you sit comfortably? feels tight in upper back after 30-45 min   ? Diagnostic tests CT head and neck 05/27/21 - no acute findings   ? Patient Stated Goals "get back to normal strength and ROM"   ? Currently in Pain? Yes   ? Pain Score 3    ? Pain Location Shoulder   ? Pain Orientation Right   ? Pain Descriptors / Indicators Discomfort   ? Pain Onset 1 to 4 weeks ago   05/27/2021  ? ?  ?  ? ?  ? ? ? ? ? ? ? ? ? ? ? ? ? ? ? ? ? ? ? ? [facility] Adult PT Treatment/Exercise - 07/01/21 0001   ? ?  ? Neck Exercises: Machines for Strengthening  ? UBE (Upper Arm Bike) L1 x 6 min (29f/3b)   ?  ? Neck Exercises: Theraband  ? Shoulder Extension 20 reps;Red   ? Rows 20 reps;Red   ?  Shoulder External Rotation 20 reps;Red   ?  ? Neck Exercises: Seated  ? Other Seated Exercise self snags into extension and rotation   ?  ? Neck Exercises: Supine  ? Neck Retraction 10 reps   ? Neck Retraction Limitations tactile cues   ? Cervical Rotation Both;5 reps   ?  ? Manual Therapy  ? Manual Therapy Joint mobilization;Soft tissue mobilization;Other (comment)   ? Manual therapy comments in supine to decrease muscle spasm and improve cervical ROM   ? Joint Mobilization PA mobs to cervical spine grade 2-3, NAGs into rotation   ? Soft tissue mobilization STM to cervical paraspinals, suboccipitals   ? Other Manual Therapy skilled palpation and monitoring during dry needling.   ? ?  ?  ? ?  ? ? ? Trigger Point Dry Needling - 07/01/21 0001   ? ? Consent Given? Yes   ? Education Handout Provided Previously provided   ? Muscles Treated Head and Neck Upper trapezius;Cervical multifidi   C5-6, bil  ?  Dry Needling Comments bilateral   ? Upper Trapezius Response Twitch reponse elicited;Palpable increased muscle length   ? Cervical multifidi Response Palpable increased muscle length   ? ?  ?  ? ?  ? ? ? ? ? ? ? ? PT Education - 07/01/21 1052   ? ? Education Details HEP update Access Code: [email or web address]  RTB issued   ? Person(s) Educated Patient   ? Methods Explanation;Demonstration;Verbal cues;Handout   ? Comprehension Verbalized understanding;Returned demonstration   ? ?  ?  ? ?  ? ? ? PT Short Term Goals - 07/01/21 0848   ? ?  ? PT SHORT TERM GOAL #1  ? Title Ind with initial HEP   ? Time 2   ? Period Weeks   ? Status On-going   ? Target Date 07/13/21   ? ?  ?  ? ?  ? ? ? ? PT Long Term Goals - 07/01/21 0848   ? ?  ? PT LONG TERM GOAL #1  ? Title Ind with progressed HEP to improve outcomes   ? Time 6   ? Period Weeks   ? Status On-going   ? Target Date 08/10/21   ?  ? PT LONG TERM GOAL #2  ? Title Pt. will demonstrate full R shoulder AROM without pain.   ? Baseline see flowsheet, pain with R shoulder AROM   ? Time 6   ?  Period Weeks   ? Status On-going   ? Target Date 08/10/21   ?  ? PT LONG TERM GOAL #3  ? Title Pt. will demonstrate 60 deg cervical rotation/flexion/extension without pain for safety with driving.   ? Baseline see flowsheet   ? Time 6   ? Period Weeks   ? Status On-going   ? Target Date 08/10/21   ?  ? PT LONG TERM GOAL #4  ? Title Pt. will report 75% improvement in neck/R shoulder/headache pain.   ? Time 6   ? Period Weeks   ? Status On-going   ? Target Date 08/10/21   ?  ? PT LONG TERM GOAL #5  ? Title Pt. will improve FOTO from 42% to at least 63% to demonstrate improved physical functioning.   ? Baseline 42%   ? Time 6   ? Period Weeks   ? Status On-going   ? Target Date 08/10/21   ? ?  ?  ? ?  ? ? ? ? ? ? ? ? Plan - 07/01/21 1053   ? ? Clinical Impression Statement Pt is making good progress, reporting less muscle tightness following manual on IE, also noted less with manual today.  Progressed HEP today for postural strengthening and self snags, then after TrDN noted decreased spasm and improved vertebral mobility with PA mobs.  He would benefit from continued skilled therapy.   ? Examination-Activity Limitations Bed Mobility;Lift;Sit;Carry;Reach Overhead   ? Examination-Participation Restrictions Occupation;Community Activity;Cleaning;Yard Work;Driving   ? Stability/Clinical Decision Making Stable/Uncomplicated   ? Rehab Potential Excellent   ? PT Frequency 2x / week   ? PT Duration 6 weeks   ? PT Treatment/Interventions ADLs/Self Care Home Management;Cryotherapy;Electrical Stimulation;Iontophoresis 4mg /ml Dexamethasone;Moist Heat;Ultrasound;Traction;Therapeutic activities;Therapeutic exercise;Neuromuscular re-education;Patient/family education;Passive range of motion;Dry needling;Manual techniques;Vestibular;Spinal Manipulations;Joint Manipulations   ? PT Next Visit Plan review and progress HEP postural strengthening, manual therapy/modalities PRN, TrDN to R UT/LS and cervical multifidi   ? PT Home Exercise  Plan Access Code: [email or web address]   ? Consulted and Agree with Plan of Care  Patient   ? ?  ?  ? ?  ? ? ?Patient will benefit from skilled therapeutic intervention in order to improve the following deficits and impairments:  Decreased activity tolerance, Decreased range of motion, Decreased strength, Increased fascial restricitons, Pain, Impaired UE functional use, Increased muscle spasms, Impaired flexibility, Postural dysfunction ? ?Visit Diagnosis: ?Cervicalgia ? ?Acute pain of right shoulder ? ?Cramp and spasm ? ? ? ? ?Problem List ?Patient Active Problem List  ? Diagnosis Date Noted  ? Concussion without loss of consciousness 06/07/2021  ? Migraine headache 06/07/2021  ? Anxiety and depression 06/07/2021  ? PCP NOTES >>>>>>>>>>>>>>>>>>>>> 01/15/2018  ? Annual physical exam 01/14/2018  ? ? ?Jena Gauss, PT, DPT  ?07/01/2021, 10:59 AM ? ?[place] ?[address]  [address] ?[place], [place], [address] ?Phone: [phone]   Fax:  [phone] ? ?Name: Mavrik Freimark [facility] ?MRN: [id number] ?Date of Birth: March 18, 1981 ? ? ? ?

## 2021-07-05 ENCOUNTER — Ambulatory Visit: Payer: PRIVATE HEALTH INSURANCE | Admitting: Physical Therapy

## 2021-07-05 ENCOUNTER — Encounter: Payer: Self-pay | Admitting: Physical Therapy

## 2021-07-05 ENCOUNTER — Other Ambulatory Visit: Payer: Self-pay

## 2021-07-05 DIAGNOSIS — M542 Cervicalgia: Secondary | ICD-10-CM

## 2021-07-05 DIAGNOSIS — R252 Cramp and spasm: Secondary | ICD-10-CM

## 2021-07-05 DIAGNOSIS — M25511 Pain in right shoulder: Secondary | ICD-10-CM

## 2021-07-05 NOTE — Therapy (Signed)
[place] ?[facility] ?[address] ?[place], [place], [address] ?Phone: [phone]   Fax:  [phone] ? ?Physical Therapy Treatment ? ?Patient Details  ?Name: Paul Alvarez ?MRN: [id number] ?Date of Birth: [birth year] ?Referring Provider (PT): Lynne Leader MD ? ? ?Encounter Date: 07/05/2021 ? ? PT End of Session - 07/05/21 0846   ? ? Visit Number 3   ? Number of Visits 12   ? Date for PT Re-Evaluation 08/10/21   ? PT Start Time [phone]   ? PT Stop Time 0930   ? PT Time Calculation (min) 44 min   ? Activity Tolerance Patient tolerated treatment well   ? Behavior During Therapy [name] for tasks assessed/performed   ? ?  ?  ? ?  ? ? ?Past Medical History:  ?Diagnosis Date  ? Allergy   ? Arthritis   ? Frequent headaches   ? GERD (gastroesophageal reflux disease)   ? ? ?Past Surgical History:  ?Procedure Laterality Date  ? COSMETIC SURGERY  2003  ? cyst removal  ? [place] EXTRACTION  2002  ? ? ?There were no vitals filed for this visit. ? ? Subjective Assessment - 07/05/21 0848   ? ? Subjective Pt. reports he is doing well, actually his L shoulder hurts today, strained it a little rolling over in bed.  Soreness after needling for a day, but then it loosened.  Consistent with exercises -4-5x/day.   ? Pertinent History migraines   ? How long can you sit comfortably? feels tight in upper back after 30-45 min   ? Diagnostic tests CT head and neck 05/27/21 - no acute findings   ? Patient Stated Goals "get back to normal strength and ROM"   ? Currently in Pain? No/denies   ? Pain Onset 1 to 4 weeks ago   05/27/2021  ? ?  ?  ? ?  ? ? ? ? ? ? ? ? ? ? ? ? ? ? ? ? ? ? ? ? [facility] Adult PT Treatment/Exercise - 07/05/21 0001   ? ?  ? Neck Exercises: Machines for Strengthening  ? UBE (Upper Arm Bike) L1.5 x 6 min (1f/3b)   ?  ? Neck Exercises: Standing  ? Wall Push Ups 20 reps   ? Lift / Chop 20 reps;Theraband   ? Theraband Level (Lift/Chop) Level 3 (Green)   ? Left / Chop  Limitations bil both lift and chop   ?  ? Neck Exercises: Sidelying  ? Other Sidelying Exercise open book x 10 bil   ?  ? Manual Therapy  ? Manual Therapy Joint mobilization;Soft tissue mobilization   ? Manual therapy comments to improve vertebral mobility and decrease spasm   ? Joint Mobilization PA mobs to upper thoracic and cervical spine in prone, in supine NAGs into rotation   ? Soft tissue mobilization STM to cervical paraspinals, suboccipitals, with gentle traction holds 3 x 10 sec   ? ?  ?  ? ?  ? ? ? ? ? ? ? ? ? ? PT Education - 07/05/21 0932   ? ? Education Details HEP update Access Code: [email or web address]   ? Person(s) Educated Patient   ? Methods Explanation;Demonstration;Handout;Verbal cues   ? Comprehension Verbalized understanding;Returned demonstration   ? ?  ?  ? ?  ? ? ? PT Short Term Goals - 07/05/21 0850   ? ?  ? PT SHORT TERM GOAL #1  ? Title Ind with  initial HEP   ? Time 2   ? Period Weeks   ? Status Achieved   ? Target Date 07/13/21   ? ?  ?  ? ?  ? ? ? ? PT Long Term Goals - 07/05/21 0851   ? ?  ? PT LONG TERM GOAL #1  ? Title Ind with progressed HEP to improve outcomes   ? Time 6   ? Period Weeks   ? Status On-going   ? Target Date 08/10/21   ?  ? PT LONG TERM GOAL #2  ? Title Pt. will demonstrate full R shoulder AROM without pain.   ? Baseline see flowsheet, pain with R shoulder AROM   ? Time 6   ? Period Weeks   ? Status On-going   ? Target Date 08/10/21   ?  ? PT LONG TERM GOAL #3  ? Title Pt. will demonstrate 60 deg cervical rotation/flexion/extension without pain for safety with driving.   ? Baseline see flowsheet   ? Time 6   ? Period Weeks   ? Status On-going   ? Target Date 08/10/21   ?  ? PT LONG TERM GOAL #4  ? Title Pt. will report 75% improvement in neck/R shoulder/headache pain.   ? Time 6   ? Period Weeks   ? Status On-going   07/05/21- no pain just stiffness today, little headache yesterday.  ? Target Date 08/10/21   ?  ? PT LONG TERM GOAL #5  ? Title Pt. will improve FOTO from 42% to at  least 63% to demonstrate improved physical functioning.   ? Baseline 42%   ? Time 6   ? Period Weeks   ? Status On-going   ? Target Date 08/10/21   ? ?  ?  ? ?  ? ? ? ? ? ? ? ? Plan - 07/05/21 1040   ? ? Clinical Impression Statement Pt. is making good progress shoulding no difficulty with cervical rotation but still pain with flexion.  Progressed exercises today with good tolerance, also added open book exercises after noted hypomobility in upper thoracic spine.  After manual therapy able to flex neck without pain.  Pt. would benefit from continued skilled therapy.   ? Examination-Activity Limitations Bed Mobility;Lift;Sit;Carry;Reach Overhead   ? Examination-Participation Restrictions Occupation;Community Activity;Cleaning;Yard Work;Driving   ? Stability/Clinical Decision Making Stable/Uncomplicated   ? Rehab Potential Excellent   ? PT Frequency 2x / week   ? PT Duration 6 weeks   ? PT Treatment/Interventions ADLs/Self Care Home Management;Cryotherapy;Electrical Stimulation;Iontophoresis 4mg /ml Dexamethasone;Moist Heat;Ultrasound;Traction;Therapeutic activities;Therapeutic exercise;Neuromuscular re-education;Patient/family education;Passive range of motion;Dry needling;Manual techniques;Vestibular;Spinal Manipulations;Joint Manipulations   ? PT Next Visit Plan review and progress HEP postural strengthening, manual therapy/modalities PRN, TrDN to R UT/LS and cervical multifidi   ? PT Home Exercise Plan Access Code: [email or web address]   ? Consulted and Agree with Plan of Care Patient   ? ?  ?  ? ?  ? ? ?Patient will benefit from skilled therapeutic intervention in order to improve the following deficits and impairments:  Decreased activity tolerance, Decreased range of motion, Decreased strength, Increased fascial restricitons, Pain, Impaired UE functional use, Increased muscle spasms, Impaired flexibility, Postural dysfunction ? ?Visit Diagnosis: ?Cervicalgia ? ?Acute pain of right shoulder ? ?Cramp and  spasm ? ? ? ? ?Problem List ?Patient Active Problem List  ? Diagnosis Date Noted  ? Concussion without loss of consciousness 06/07/2021  ? Migraine headache 06/07/2021  ? Anxiety and depression 06/07/2021  ? PCP  NOTES >>>>>>>>>>>>>>>>>>>>> 01/15/2018  ? Annual physical exam 01/14/2018  ? ? ?Rennie Natter, PT, DPT  ?07/05/2021, 10:48 AM ? ?[facility] [facility] ?[facility] ?[address] ?[place], [place], [address] ?Phone: [phone]   Fax:  [phone] ? ?Name: Paul Alvarez ?MRN: [id number] ?Date of Birth: 02/25/1981 ? ? ? ?

## 2021-07-05 NOTE — Patient Instructions (Signed)
Access Code: [email or web address] ?URL: [email or web address] ?Date: 07/05/2021 ?Prepared by: Harrie Foreman ? ?Exercises ?Seated Cervical Retraction - 5 x daily - 7 x weekly - 1 sets - 5 reps - 5 sec hold ?Seated Shoulder Rolls - 5 x daily - 7 x weekly - 1 sets - 5 reps ?Seated Scapular Retraction - 5 x daily - 7 x weekly - 1 sets - 5 reps - 5 sec hold ?Seated Levator Scapulae Stretch - 3 x daily - 7 x weekly - 1 sets - 3 reps - 15sec hold ?Shoulder extension with resistance - Neutral - 1 x daily - 7 x weekly - 3 sets - 10 reps ?Scapular Retraction with Resistance - 1 x daily - 7 x weekly - 3 sets - 10 reps ?Shoulder W - External Rotation with Resistance - 1 x daily - 7 x weekly - 3 sets - 10 reps ?Cervical Extension AROM with Strap - 1 x daily - 7 x weekly - 1 sets - 10 reps ?Mid-Lower Cervical Extension SNAG with Strap - 1 x daily - 7 x weekly - 1 sets - 10 reps ?Seated Assisted Cervical Rotation with Towel - 1 x daily - 7 x weekly - 1 sets - 10 reps ?Sidelying Open Book Thoracic Lumbar Rotation and Extension - 1 x daily - 7 x weekly - 2-3 sets - 10 reps ?Wall Angels - 1 x daily - 7 x weekly - 2-3 sets - 10 reps ?Wall Push Up - 1 x daily - 7 x weekly - 2-3 sets - 10 reps ?Standing Diagonal Chop - 1 x daily - 7 x weekly - 3 sets - 10 reps ?Reverse Chop with Resistance - 1 x daily - 7 x weekly - 3 sets - 10 reps ? ?

## 2021-07-08 ENCOUNTER — Other Ambulatory Visit: Payer: Self-pay

## 2021-07-08 ENCOUNTER — Ambulatory Visit: Payer: PRIVATE HEALTH INSURANCE

## 2021-07-08 ENCOUNTER — Ambulatory Visit (INDEPENDENT_AMBULATORY_CARE_PROVIDER_SITE_OTHER): Payer: PRIVATE HEALTH INSURANCE | Admitting: Family Medicine

## 2021-07-08 VITALS — BP 152/100 | HR 85 | Ht 71.0 in | Wt 256.8 lb

## 2021-07-08 DIAGNOSIS — M542 Cervicalgia: Secondary | ICD-10-CM | POA: Diagnosis not present

## 2021-07-08 DIAGNOSIS — R41840 Attention and concentration deficit: Secondary | ICD-10-CM | POA: Diagnosis not present

## 2021-07-08 DIAGNOSIS — F32A Depression, unspecified: Secondary | ICD-10-CM

## 2021-07-08 DIAGNOSIS — F419 Anxiety disorder, unspecified: Secondary | ICD-10-CM

## 2021-07-08 DIAGNOSIS — S060X0D Concussion without loss of consciousness, subsequent encounter: Secondary | ICD-10-CM | POA: Diagnosis not present

## 2021-07-08 DIAGNOSIS — M25511 Pain in right shoulder: Secondary | ICD-10-CM | POA: Diagnosis not present

## 2021-07-08 DIAGNOSIS — R252 Cramp and spasm: Secondary | ICD-10-CM
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
## 2021-07-08 NOTE — Therapy (Signed)
[place] ?[facility] ?[address]  [address] ?[place], [place], [address] ?Phone: [phone]   Fax:  [phone] ? ?Physical Therapy Treatment ? ?Patient Details  ?Name: Paul Alvarez [facility] ?MRN: [id number] ?Date of Birth: [birth year] ?Referring Provider (PT): [name] MD ? ? ?Encounter Date: 07/08/2021 ? ? PT End of Session - 07/08/21 0932   ? ? Visit Number 4   ? Number of Visits 12   ? Date for PT Re-Evaluation 08/10/21   ? PT Start Time [phone]   ? PT Stop Time [phone]   ? PT Time Calculation (min) 42 min   ? Activity Tolerance Patient tolerated treatment well   ? Behavior During Therapy [facility] for tasks assessed/performed   ? ?  ?  ? ?  ? ? ?Past Medical History:  ?Diagnosis Date  ? Allergy   ? Arthritis   ? Frequent headaches   ? GERD (gastroesophageal reflux disease)   ? ? ?Past Surgical History:  ?Procedure Laterality Date  ? COSMETIC SURGERY  2003  ? cyst removal  ? WISDOM TOOTH EXTRACTION  2002  ? ? ?There were no vitals filed for this visit. ? ? Subjective Assessment - 07/08/21 0849   ? ? Subjective Pt has some tightness along the R side of the neck this morning, very consistent with HEP.   ? Pertinent History migraines   ? Diagnostic tests CT head and neck 05/27/21 - no acute findings   ? Patient Stated Goals "get back to normal strength and ROM"   ? Currently in Pain? Yes   ? Pain Score 2    ? Pain Location Neck   ? Pain Orientation Right   ? Pain Descriptors / Indicators Dull   ? Pain Type Acute pain   ? ?  ?  ? ?  ? ? ? ? ? ? ? ? ? ? ? ? ? ? ? ? ? ? ? ? [facility] Adult PT Treatment/Exercise - 07/08/21 0001   ? ?  ? Neck Exercises: Machines for Strengthening  ? UBE (Upper Arm Bike) L2.0 x 6 min (42f/3b)   ? Cybex Chest Press 20lb serratus punch 2x10   ?  ? Neck Exercises: Theraband  ? Shoulder Extension Green;10 reps   2 sets  ? Rows Green;10 reps   2 sets  ? Shoulder External Rotation Green;10 reps   2 sets  ?  ? Neck Exercises: Standing  ? Other Standing Exercises  open book at wall x 10, R/L   ?  ? Neck Exercises: Seated  ? Other Seated Exercise thoracic extension over chair 10x3"   ?  ? Neck Exercises: Sidelying  ? Other Sidelying Exercise R/L open book 5x 3 second holds   ?  ? Neck Exercises: Stretches  ? Levator Stretch Right;2 reps;30 seconds   ?  ? Manual Therapy  ? Manual Therapy Soft tissue mobilization;Myofascial release   ? Soft tissue mobilization STM to R LS, upper rhomboids   ? Myofascial Release pin and stretch to R LS   ? ?  ?  ? ?  ? ? ? ? ? ? ? ? ? ? ? ? PT Short Term Goals - 07/05/21 0850   ? ?  ? PT SHORT TERM GOAL #1  ? Title Ind with initial HEP   ? Time 2   ? Period Weeks   ? Status Achieved   ? Target Date 07/13/21   ? ?  ?  ? ?  ? ? ? ?  PT Long Term Goals - 07/05/21 0851   ? ?  ? PT LONG TERM GOAL #1  ? Title Ind with progressed HEP to improve outcomes   ? Time 6   ? Period Weeks   ? Status On-going   ? Target Date 08/10/21   ?  ? PT LONG TERM GOAL #2  ? Title Pt. will demonstrate full R shoulder AROM without pain.   ? Baseline see flowsheet, pain with R shoulder AROM   ? Time 6   ? Period Weeks   ? Status On-going   ? Target Date 08/10/21   ?  ? PT LONG TERM GOAL #3  ? Title Pt. will demonstrate 60 deg cervical rotation/flexion/extension without pain for safety with driving.   ? Baseline see flowsheet   ? Time 6   ? Period Weeks   ? Status On-going   ? Target Date 08/10/21   ?  ? PT LONG TERM GOAL #4  ? Title Pt. will report 75% improvement in neck/R shoulder/headache pain.   ? Time 6   ? Period Weeks   ? Status On-going   07/05/21- no pain just stiffness today, little headache yesterday.  ? Target Date 08/10/21   ?  ? PT LONG TERM GOAL #5  ? Title Pt. will improve FOTO from 42% to at least 63% to demonstrate improved physical functioning.   ? Baseline 42%   ? Time 6   ? Period Weeks   ? Status On-going   ? Target Date 08/10/21   ? ?  ?  ? ?  ? ? ? ? ? ? ? ? Plan - 07/08/21 0932   ? ? Clinical Impression Statement Pt had reported some increased tension  along the R levator muscle today. Started session with STM to mobilize the soft tissue restrictions along the R levator scapulae and upper rhomboid muscles. Progressed scap stability exercises to GTB today. Instruction needed to keep 90 deg bend at elbow to isolate external rotators with TB today. Otherwise showed a good demonstration of the exercises.   ? PT Frequency 2x / week   ? PT Duration 6 weeks   ? PT Treatment/Interventions ADLs/Self Care Home Management;Cryotherapy;Electrical Stimulation;Iontophoresis 4mg /ml Dexamethasone;Moist Heat;Ultrasound;Traction;Therapeutic activities;Therapeutic exercise;Neuromuscular re-education;Patient/family education;Passive range of motion;Dry needling;Manual techniques;Vestibular;Spinal Manipulations;Joint Manipulations   ? PT Next Visit Plan progress postural strengthening, manual therapy/modalities PRN, TrDN to R UT/LS and cervical multifidi   ? PT Home Exercise Plan Access Code: [email or web address]   ? Consulted and Agree with Plan of Care Patient   ? ?  ?  ? ?  ? ? ?Patient will benefit from skilled therapeutic intervention in order to improve the following deficits and impairments:  Decreased activity tolerance, Decreased range of motion, Decreased strength, Increased fascial restricitons, Pain, Impaired UE functional use, Increased muscle spasms, Impaired flexibility, Postural dysfunction ? ?Visit Diagnosis: ?Cervicalgia ? ?Acute pain of right shoulder ? ?Cramp and spasm ? ? ? ? ?Problem List ?Patient Active Problem List  ? Diagnosis Date Noted  ? Concussion without loss of consciousness 06/07/2021  ? Migraine headache 06/07/2021  ? Anxiety and depression 06/07/2021  ? PCP NOTES >>>>>>>>>>>>>>>>>>>>> 01/15/2018  ? Annual physical exam 01/14/2018  ? ? ?01/16/2018, PTA ?07/08/2021, 10:26 AM ? ?[place] ?[facility] ?[address]  [address] ?[place], [place], [place] ?Phone: [phone]   Fax:  [phone] ? ?Name: Kasten Leveque  [facility] ?MRN: [facility] ?Date of Birth: July 29, 1980 ? ? ? ?

## 2021-07-08 NOTE — Patient Instructions (Addendum)
Thank you for coming in today.  ? ?Continue physical therapy and home exercises ? ?Recheck back in 6 weeks ?

## 2021-07-12 ENCOUNTER — Other Ambulatory Visit: Payer: Self-pay

## 2021-07-12 ENCOUNTER — Encounter: Payer: Self-pay | Admitting: Physical Therapy

## 2021-07-12 ENCOUNTER — Ambulatory Visit: Payer: PRIVATE HEALTH INSURANCE | Admitting: Physical Therapy

## 2021-07-12 DIAGNOSIS — M25511 Pain in right shoulder: Secondary | ICD-10-CM

## 2021-07-12 DIAGNOSIS — M542 Cervicalgia: Secondary | ICD-10-CM

## 2021-07-12 DIAGNOSIS — R252 Cramp and spasm: Secondary | ICD-10-CM

## 2021-07-12 NOTE — Therapy (Signed)
[place] ?[facility] ?[address] ?[place], [place], [address] ?Phone: [phone]   Fax:  [phone] ? ?Physical Therapy Treatment ? ?Patient Details  ?Name: Paul Alvarez [facility] ?MRN: [id number] ?Date of Birth: 07/26/1980 ?Referring Provider (PT): Lynne Leader MD ? ? ?Encounter Date: 07/12/2021 ? ? PT End of Session - 07/12/21 0847   ? ? Visit Number 5   ? Number of Visits 12   ? Date for PT Re-Evaluation 08/10/21   ? PT Start Time [phone]   ? PT Stop Time 0930   ? PT Time Calculation (min) 43 min   ? Activity Tolerance Patient tolerated treatment well   ? Behavior During Therapy [facility] for tasks assessed/performed   ? ?  ?  ? ?  ? ? ?Past Medical History:  ?Diagnosis Date  ? Allergy   ? Arthritis   ? Frequent headaches   ? GERD (gastroesophageal reflux disease)   ? ? ?Past Surgical History:  ?Procedure Laterality Date  ? COSMETIC SURGERY  2003  ? cyst removal  ? [place] EXTRACTION  2002  ? ? ?There were no vitals filed for this visit. ? ? Subjective Assessment - 07/12/21 0848   ? ? Subjective Pt. is doing well today.  Didn't feel much pain last week and ROM continues to improve.  Still get a little stiffness and soreness.  No pain just very mild discomfort.   ? Pertinent History migraines   ? Diagnostic tests CT head and neck 05/27/21 - no acute findings   ? Patient Stated Goals "get back to normal strength and ROM"   ? Currently in Pain? No/denies   ? ?  ?  ? ?  ? ? ? ? ? ? ? ? ? ? ? ? ? ? ? ? ? ? ? ? [place] Adult PT Treatment/Exercise - 07/12/21 0001   ? ?  ? Neck Exercises: Machines for Strengthening  ? UBE (Upper Arm Bike) L2.0 x 6 min (35f/3b)   ?  ? Neck Exercises: Seated  ? Neck Retraction 10 reps   ? Neck Retraction Limitations tactile cues at occiput for resistance.   ? Other Seated Exercise on P-ball, seated march for postural training, walkouts to bridge x 10 for core strengthening.  Demo and SBA   ?  ? Neck Exercises: Prone  ? Shoulder Extension  10 reps   ? Shoulder Extension Weights (lbs) 6#   ? Shoulder Extension Limitations support on bench   ? Rows 20 reps   ? Rows Weights (lbs) 6#   ? Rows Limitations bil prone on bench for neck extension   ? Plank 3 x 30 sec elbows and toes   ? Other Prone Exercise quadruped thread the needle r/l x 10   ? Other Prone Exercise bird dogs x 10 bil   ?  ? Neck Exercises: Stretches  ? Levator Stretch Right;2 reps;30 seconds   ?  ? Manual Therapy  ? Manual Therapy Soft tissue mobilization   ? Manual therapy comments in sitting to decrease muscle spasm   ? Soft tissue mobilization STM to cervical paraspinals, bil UT   ? ?  ?  ? ?  ? ? ? ? ? ? ? ? ? ? PT Education - 07/12/21 1024   ? ? Education Details HEP update Access Code: [email or web address]   ? Person(s) Educated Patient   ? Methods Explanation;Demonstration;Verbal cues;Handout   ? Comprehension Verbalized understanding;Returned demonstration   ? ?  ?  ? ?  ? ? ?  PT Short Term Goals - 07/05/21 0850   ? ?  ? PT SHORT TERM GOAL #1  ? Title Ind with initial HEP   ? Time 2   ? Period Weeks   ? Status Achieved   ? Target Date 07/13/21   ? ?  ?  ? ?  ? ? ? ? PT Long Term Goals - 07/05/21 0851   ? ?  ? PT LONG TERM GOAL #1  ? Title Ind with progressed HEP to improve outcomes   ? Time 6   ? Period Weeks   ? Status On-going   ? Target Date 08/10/21   ?  ? PT LONG TERM GOAL #2  ? Title Pt. will demonstrate full R shoulder AROM without pain.   ? Baseline see flowsheet, pain with R shoulder AROM   ? Time 6   ? Period Weeks   ? Status On-going   ? Target Date 08/10/21   ?  ? PT LONG TERM GOAL #3  ? Title Pt. will demonstrate 60 deg cervical rotation/flexion/extension without pain for safety with driving.   ? Baseline see flowsheet   ? Time 6   ? Period Weeks   ? Status On-going   ? Target Date 08/10/21   ?  ? PT LONG TERM GOAL #4  ? Title Pt. will report 75% improvement in neck/R shoulder/headache pain.   ? Time 6   ? Period Weeks   ? Status On-going   07/05/21- no pain just stiffness today,  little headache yesterday.  ? Target Date 08/10/21   ?  ? PT LONG TERM GOAL #5  ? Title Pt. will improve FOTO from 42% to at least 63% to demonstrate improved physical functioning.   ? Baseline 42%   ? Time 6   ? Period Weeks   ? Status On-going   ? Target Date 08/10/21   ? ?  ?  ? ?  ? ? ? ? ? ? ? ? Plan - 07/12/21 1025   ? ? Clinical Impression Statement Patient reports decreased pain and tightness in neck over the last week.  Today focused skilled interventions on progression of exercises, focusing on thoracic mobility, body weight supported exercise, and core strengthening.  He tolerated all exercises well without increased pain.  Noted decreased tightness in UT/levator, still some tightness in cervical paraspinals with manual therapy.   ? PT Frequency 2x / week   ? PT Duration 6 weeks   ? PT Treatment/Interventions ADLs/Self Care Home Management;Cryotherapy;Electrical Stimulation;Iontophoresis 4mg /ml Dexamethasone;Moist Heat;Ultrasound;Traction;Therapeutic activities;Therapeutic exercise;Neuromuscular re-education;Patient/family education;Passive range of motion;Dry needling;Manual techniques;Vestibular;Spinal Manipulations;Joint Manipulations   ? PT Next Visit Plan progress postural strengthening, manual therapy/modalities PRN, TrDN to R UT/LS and cervical multifidi   ? PT Home Exercise Plan Access Code: [email or web address]   ? Consulted and Agree with Plan of Care Patient   ? ?  ?  ? ?  ? ? ?Patient will benefit from skilled therapeutic intervention in order to improve the following deficits and impairments:  Decreased activity tolerance, Decreased range of motion, Decreased strength, Increased fascial restricitons, Pain, Impaired UE functional use, Increased muscle spasms, Impaired flexibility, Postural dysfunction ? ?Visit Diagnosis: ?Cervicalgia ? ?Acute pain of right shoulder ? ?Cramp and spasm ? ? ? ? ?Problem List ?Patient Active Problem List  ? Diagnosis Date Noted  ? Concussion without loss of consciousness  06/07/2021  ? Migraine headache 06/07/2021  ? Anxiety and depression 06/07/2021  ? PCP NOTES >>>>>>>>>>>>>>>>>>>>> 01/15/2018  ? Annual  physical exam 01/14/2018  ? ? ?Rennie Natter, PT, DPT  ?07/12/2021, 12:13 PM ? ?[place] ?[facility] ?[address] ?[place], [place], [address] ?Phone: [phone]   Fax:  [phone] ? ?Name: Jodeci Cink [facility] ?MRN: [id number] ?Date of Birth: April 28, 1981 ? ? ? ?

## 2021-07-12 NOTE — Patient Instructions (Signed)
Access Code: [email or web address] ?URL: [email or web address] ?Date: 07/12/2021 ?Prepared by: Harrie Foreman ? ?Exercises  Added  ?Bird Dog - 1 x daily - 7 x weekly - 3 sets - 10 reps ?Standard Plank - 1 x daily - 7 x weekly - 3 sets - 10 reps ?Quadruped Full Range Thoracic Rotation with Reach - 1 x daily - 7 x weekly - 3 sets - 10 reps ?Prone on Swiss Ball Shoulder Row to ER to Overhead Reach w/ weight - 1 x daily - 3 x weekly - 3 sets - 10 reps ?Prone Shoulder Extension on Swiss Ball with Dumbbells - 1 x daily - 3 x weekly - 3 sets - 10 reps ?Prone Shoulder Extension on [place] Ball with Dumbbells - 1 x daily - 7 x weekly - 3 sets - 10 reps ? ?

## 2021-07-15 ENCOUNTER — Ambulatory Visit: Payer: PRIVATE HEALTH INSURANCE

## 2021-07-19 ENCOUNTER — Ambulatory Visit: Payer: PRIVATE HEALTH INSURANCE | Admitting: Physical Therapy

## 2021-07-19 ENCOUNTER — Other Ambulatory Visit: Payer: Self-pay

## 2021-07-19 ENCOUNTER — Encounter: Payer: Self-pay | Admitting: Physical Therapy

## 2021-07-19 DIAGNOSIS — R252 Cramp and spasm: Secondary | ICD-10-CM

## 2021-07-19 DIAGNOSIS — M542 Cervicalgia: Secondary | ICD-10-CM

## 2021-07-19 DIAGNOSIS — M25511 Pain in right shoulder: Secondary | ICD-10-CM

## 2021-07-19 NOTE — Therapy (Signed)
[place] ?[facility] ?[address]  [address] ?[place], [place], [address] ?Phone: [phone]   Fax:  [phone] ? ?Physical Therapy Treatment ? ?Patient Details  ?Name: Paul Alvarez [facility] ?MRN: [id number] ?Date of Birth: [birth year] ?Referring Provider (PT): [name] MD ? ? ?Encounter Date: 07/19/2021 ? ? PT End of Session - 07/19/21 4287   ? ? Visit Number 6   ? Number of Visits 12   ? Date for PT Re-Evaluation 08/10/21   ? PT Start Time [phone]   ? PT Stop Time 0929   ? PT Time Calculation (min) 34 min   ? Activity Tolerance Patient tolerated treatment well   ? Behavior During Therapy [facility] for tasks assessed/performed   ? ?  ?  ? ?  ? ? ?Past Medical History:  ?Diagnosis Date  ? Allergy   ? Arthritis   ? Frequent headaches   ? GERD (gastroesophageal reflux disease)   ? ? ?Past Surgical History:  ?Procedure Laterality Date  ? COSMETIC SURGERY  2003  ? cyst removal  ? WISDOM TOOTH EXTRACTION  2002  ? ? ?There were no vitals filed for this visit. ? ? Subjective Assessment - 07/19/21 0857   ? ? Subjective Pt. reports neck has been doing better, just stiff mostly in mornings, R shoulder is also doing well.  Hurt his L shoulder, thinks he strained it or slept on it weird.  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   ? Pertinent History migraines   ? Diagnostic tests CT head and neck 05/27/21 - no acute findings   ? Patient Stated Goals "get back to normal strength and ROM"   ? Currently in Pain? No/denies   ? ?  ?  ? ?  ? ? ? ? ? ? ? ? ? ? ? ? ? ? ? ? ? ? ? ? [facility] Adult PT Treatment/Exercise - 07/19/21 0001   ? ?  ? Neck Exercises: Machines for Strengthening  ? UBE (Upper Arm Bike) L4.0 x 6 min (48f/3b)   ?  ? Neck Exercises: Theraband  ? Shoulder Extension 20 reps;Green   ? Rows 20 reps;Green   ? Shoulder External Rotation 20 reps;Green   ?  ? Neck Exercises: Standing  ? Other Standing Exercises wall angels x 10 - less difficult   ?  ? Neck  Exercises: Prone  ? Other Prone Exercise quadruped thread the needle r/l x 10   ? Other Prone Exercise bird dogs x 10 bil   ?  ? Neck Exercises: Stretches  ? Levator Stretch Right;2 reps;30 seconds   ?  ? Manual Therapy  ? Manual Therapy Soft tissue mobilization   ? Manual therapy comments in sitting to decrease muscle spasm   ? Soft tissue mobilization IASTM wtih s/s tools to L shoulder/biceps, cervical paraspinals.   ? ?  ?  ? ?  ? ? ? ? ? ? ? ? ? ? ? ? PT Short Term Goals - 07/05/21 0850   ? ?  ? PT SHORT TERM GOAL #1  ? Title Ind with initial HEP   ? Time 2   ? Period Weeks   ? Status Achieved   ? Target Date 07/13/21   ? ?  ?  ? ?  ? ? ? ? PT Long Term Goals - 07/19/21 0859   ? ?  ? PT LONG TERM GOAL #1  ? Title Ind with progressed  HEP to improve outcomes   ? Time 6   ? Period Weeks   ? Status On-going   ? Target Date 08/10/21   ?  ? PT LONG TERM GOAL #2  ? Title Pt. will demonstrate full R shoulder AROM without pain.   ? Baseline see flowsheet, pain with R shoulder AROM   ? Time 6   ? Period Weeks   ? Status On-going   ? Target Date 08/10/21   ?  ? PT LONG TERM GOAL #3  ? Title Pt. will demonstrate 60 deg cervical rotation/flexion/extension without pain for safety with driving.   ? Baseline see flowsheet   ? Time 6   ? Period Weeks   ? Status On-going   ? Target Date 08/10/21   ?  ? PT LONG TERM GOAL #4  ? Title Pt. will report 75% improvement in neck/R shoulder/headache pain.   ? Time 6   ? Period Weeks   ? Status Achieved   07/05/21- no pain just stiffness today, little headache yesterday. 07/19/21- no pain.  ? Target Date 08/10/21   ?  ? PT LONG TERM GOAL #5  ? Title Pt. will improve FOTO from 42% to at least 63% to demonstrate improved physical functioning.   ? Baseline 42%   ? Time 6   ? Period Weeks   ? Status On-going   ? Target Date 08/10/21   ? ?  ?  ? ?  ? ? ? ? ? ? ? ? Plan - 07/19/21 1109   ? ? Clinical Impression Statement Patient reporting significant improvement overall in neck and R shoulder, but  hesitant to stop therapy, prefering to finish next week to be sure symptoms do not return.  Because of L shoulder, focused on lighter resistance but continued strengthening and stretching to decrease muscle tension.  Reported decreased tightness following manual therapy.   ? PT Frequency 2x / week   ? PT Duration 6 weeks   ? PT Treatment/Interventions ADLs/Self Care Home Management;Cryotherapy;Electrical Stimulation;Iontophoresis 4mg /ml Dexamethasone;Moist Heat;Ultrasound;Traction;Therapeutic activities;Therapeutic exercise;Neuromuscular re-education;Patient/family education;Passive range of motion;Dry needling;Manual techniques;Vestibular;Spinal Manipulations;Joint Manipulations   ? PT Next Visit Plan progress postural strengthening, manual therapy/modalities PRN, TrDN to R UT/LS and cervical multifidi   ? PT Home Exercise Plan Access Code: [email or web address]   ? Consulted and Agree with Plan of Care Patient   ? ?  ?  ? ?  ? ? ?Patient will benefit from skilled therapeutic intervention in order to improve the following deficits and impairments:  Decreased activity tolerance, Decreased range of motion, Decreased strength, Increased fascial restricitons, Pain, Impaired UE functional use, Increased muscle spasms, Impaired flexibility, Postural dysfunction ? ?Visit Diagnosis: ?Cervicalgia ? ?Acute pain of right shoulder ? ?Cramp and spasm ? ? ? ? ?Problem List ?Patient Active Problem List  ? Diagnosis Date Noted  ? Concussion without loss of consciousness 06/07/2021  ? Migraine headache 06/07/2021  ? Anxiety and depression 06/07/2021  ? PCP NOTES >>>>>>>>>>>>>>>>>>>>> 01/15/2018  ? Annual physical exam 01/14/2018  ? ? ?01/16/2018, PT, DPT  ?07/19/2021, 11:11 AM ? ?[place] ?[facility] ?[address]  [address] ?[place], [place], [place] ?Phone: [phone]   Fax:  [phone] ? ?Name: Mckoy Bhakta [facility] ?MRN: [facility] ?Date of Birth: [birth year] ? ? ? ?

## 2021-07-22 ENCOUNTER — Other Ambulatory Visit: Payer: Self-pay

## 2021-07-22 ENCOUNTER — Ambulatory Visit: Payer: PRIVATE HEALTH INSURANCE

## 2021-07-22 DIAGNOSIS — M25511 Pain in right shoulder: Secondary | ICD-10-CM

## 2021-07-22 DIAGNOSIS — M542 Cervicalgia: Secondary | ICD-10-CM

## 2021-07-22 DIAGNOSIS — R252 Cramp and spasm: Secondary | ICD-10-CM

## 2021-07-22 NOTE — Therapy (Signed)
?[facility] ?[address] ?[place], [place], [address] ?Phone: [phone]   Fax:  [phone] ? ?Physical Therapy Treatment ? ?Patient Details  ?Name: Paul Alvarez ?MRN: [id number] ?Date of Birth: 09-Aug-1980 ?Referring Provider (PT): Lynne Leader MD ? ? ?Encounter Date: 07/22/2021 ? ? PT End of Session - 07/22/21 6294   ? ? Visit Number 7   ? Number of Visits 12   ? Date for PT Re-Evaluation 08/10/21   ? PT Start Time [phone]   ? PT Stop Time 0929   ? PT Time Calculation (min) 39 min   ? Activity Tolerance Patient tolerated treatment well   ? Behavior During Therapy [facility] for tasks assessed/performed   ? ?  ?  ? ?  ? ? ?Past Medical History:  ?Diagnosis Date  ? Allergy   ? Arthritis   ? Frequent headaches   ? GERD (gastroesophageal reflux disease)   ? ? ?Past Surgical History:  ?Procedure Laterality Date  ? COSMETIC SURGERY  2003  ? cyst removal  ? [place] EXTRACTION  2002  ? ? ?There were no vitals filed for this visit. ? ? Subjective Assessment - 07/22/21 0851   ? ? Subjective Pt feels as if he has regained most of his function, even though he hurt his L shoulder the other day lifting an onject OH this has resoolved now.   ? Pertinent History migraines   ? Diagnostic tests CT head and neck 05/27/21 - no acute findings   ? Patient Stated Goals "get back to normal strength and ROM"   ? Currently in Pain? No/denies   ? ?  ?  ? ?  ? ? ? ? ? [facility] PT Assessment - 07/22/21 0001   ? ?  ? AROM  ? Right Shoulder Flexion 161 Degrees   ? Right Shoulder ABduction 165 Degrees   ? Right Shoulder Internal Rotation --   FIR to T9  ? Right Shoulder External Rotation --   FER to T2  ? Cervical Flexion 32   ? Cervical Extension 61   ? Cervical - Right Rotation 66   ? Cervical - Left Rotation 71   ? ?  ?  ? ?  ? ? ? ? ? ? ? ? ? ? ? ? ? ? ? ? [facility] Adult PT Treatment/Exercise - 07/22/21 0001   ? ?  ? Neck Exercises: Machines for Strengthening  ? Nustep L6x1mn    ?  ? Lumbar Exercises: Standing  ? Row Strengthening;Both;10 reps   2 sets; 1st set narrow grip; 2nd set wide grip  ? Row Limitations on cable machine; 20lb standing   ? Shoulder Extension Strengthening;Both;10 reps   ? Shoulder Extension Limitations on lat pull machine; 20lb   ? Other Standing Lumbar Exercises pallof press with GTB R/L in squat position x 10   ? Other Standing Lumbar Exercises UE chops with GTB in half kneel position R/L x 10   ? ?  ?  ? ?  ? ? ? ? ? ? ? ? ? ? ? ? PT Short Term Goals - 07/05/21 0850   ? ?  ? PT SHORT TERM GOAL #1  ? Title Ind with initial HEP   ? Time 2   ? Period Weeks   ? Status Achieved   ? Target Date 07/13/21   ? ?  ?  ? ?  ? ? ? ? PT Long Term  Goals - 07/22/21 0901   ? ?  ? PT LONG TERM GOAL #1  ? Title Ind with progressed HEP to improve outcomes   ? Time 6   ? Period Weeks   ? Status On-going   ? Target Date 08/10/21   ?  ? PT LONG TERM GOAL #2  ? Title Pt. will demonstrate full R shoulder AROM without pain.   ? Baseline see flowsheet, pain with R shoulder AROM   ? Time 6   ? Period Weeks   ? Status Achieved   07/22/21  ? Target Date 08/10/21   ?  ? PT LONG TERM GOAL #3  ? Title Pt. will demonstrate 60 deg cervical rotation/flexion/extension without pain for safety with driving.   ? Baseline see flowsheet   ? Time 6   ? Period Weeks   ? Status Partially Met   met for extension and Bil rotation  ? Target Date 08/10/21   ?  ? PT LONG TERM GOAL #4  ? Title Pt. will report 75% improvement in neck/R shoulder/headache pain.   ? Time 6   ? Period Weeks   ? Status Achieved   07/05/21- no pain just stiffness today, little headache yesterday. 07/19/21- no pain.  ? Target Date 08/10/21   ?  ? PT LONG TERM GOAL #5  ? Title Pt. will improve FOTO from 42% to at least 63% to demonstrate improved physical functioning.   ? Baseline 42%   ? Time 6   ? Period Weeks   ? Status On-going   ? Target Date 08/10/21   ? ?  ?  ? ?  ? ? ? ? ? ? ? ? Plan - 07/22/21 0931   ? ? Clinical Impression Statement  Pt has met LTG #2 with no pain noted with R shoulder movements and full functional ROM. LTG #3 now is partially met but cervical flexion still is limited. His R sided neck tightness shows to be more flexible with increased L cervial rotation. He responded well to the progression of exercises today. He demonstrated a good performance of exercises with minimal cuing to correct form. He feels that he is ready to transition to HEP due to his progress given he only has one appointment left with PT.   ? PT Frequency 2x / week   ? PT Duration 6 weeks   ? PT Treatment/Interventions ADLs/Self Care Home Management;Cryotherapy;Electrical Stimulation;Iontophoresis 8m/ml Dexamethasone;Moist Heat;Ultrasound;Traction;Therapeutic activities;Therapeutic exercise;Neuromuscular re-education;Patient/family education;Passive range of motion;Dry needling;Manual techniques;Vestibular;Spinal Manipulations;Joint Manipulations   ? PT Next Visit Plan discuss wrapping up PT; progress postural strengthening, manual therapy/modalities PRN, TrDN to R UT/LS and cervical multifidi   ? PT Home Exercise Plan Access Code: [email or web address]   ? Consulted and Agree with Plan of Care Patient   ? ?  ?  ? ?  ? ? ?Patient will benefit from skilled therapeutic intervention in order to improve the following deficits and impairments:  Decreased activity tolerance, Decreased range of motion, Decreased strength, Increased fascial restricitons, Pain, Impaired UE functional use, Increased muscle spasms, Impaired flexibility, Postural dysfunction ? ?Visit Diagnosis: ?Cervicalgia ? ?Acute pain of right shoulder ? ?Cramp and spasm ? ? ? ? ?Problem List ?Patient Active Problem List  ? Diagnosis Date Noted  ? Concussion without loss of consciousness 06/07/2021  ? Migraine headache 06/07/2021  ? Anxiety and depression 06/07/2021  ? PCP NOTES >>>>>>>>>>>>>>>>>>>>> 01/15/2018  ? Annual physical exam 01/14/2018  ? ? ?BArtist Pais PTA ?07/22/2021, 9:56 AM ? ?  [facility] ?[facility] ?[address] ?[place], [place], [address] ?Phone: [phone]   Fax:  [phone] ? ?Name: Paul Alvarez ?MRN: [id number] ?Date of Birth: [birth year] ? ? ? ?

## 2021-07-26 ENCOUNTER — Encounter: Payer: Self-pay | Admitting: Physical Therapy

## 2021-07-26 ENCOUNTER — Other Ambulatory Visit: Payer: Self-pay

## 2021-07-26 ENCOUNTER — Ambulatory Visit: Payer: PRIVATE HEALTH INSURANCE | Admitting: Physical Therapy

## 2021-07-26 DIAGNOSIS — M542 Cervicalgia: Secondary | ICD-10-CM | POA: Diagnosis not present

## 2021-07-26 DIAGNOSIS — R252 Cramp and spasm: Secondary | ICD-10-CM

## 2021-07-26 DIAGNOSIS — M25511 Pain in right shoulder: Secondary | ICD-10-CM
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
## 2021-08-19 ENCOUNTER — Other Ambulatory Visit: Payer: Self-pay | Admitting: Internal Medicine

## 2021-08-19 ENCOUNTER — Ambulatory Visit (INDEPENDENT_AMBULATORY_CARE_PROVIDER_SITE_OTHER): Payer: PRIVATE HEALTH INSURANCE | Admitting: Family Medicine

## 2021-08-19 ENCOUNTER — Ambulatory Visit: Payer: PRIVATE HEALTH INSURANCE | Admitting: Internal Medicine

## 2021-08-19 ENCOUNTER — Encounter: Payer: Self-pay | Admitting: Family Medicine

## 2021-08-19 VITALS — BP 120/82 | HR 101 | Ht 71.0 in | Wt 256.0 lb

## 2021-08-19 DIAGNOSIS — S060X0D Concussion without loss of consciousness, subsequent encounter: Secondary | ICD-10-CM | POA: Diagnosis not present

## 2021-08-19 NOTE — Patient Instructions (Addendum)
Good to see you today. ? ?Follow-up:as needed.  ? ?[facility]  ?
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
## 2021-08-26 ENCOUNTER — Ambulatory Visit (INDEPENDENT_AMBULATORY_CARE_PROVIDER_SITE_OTHER): Payer: PRIVATE HEALTH INSURANCE | Admitting: Internal Medicine

## 2021-08-26 ENCOUNTER — Encounter: Payer: Self-pay | Admitting: Internal Medicine

## 2021-08-26 VITALS — BP 126/90 | HR 90 | Temp 98.2°F | Resp 18 | Ht 71.0 in | Wt 258.0 lb

## 2021-08-26 DIAGNOSIS — F32A Depression, unspecified: Secondary | ICD-10-CM | POA: Diagnosis not present

## 2021-08-26 DIAGNOSIS — F419 Anxiety disorder, unspecified: Secondary | ICD-10-CM

## 2021-08-26 NOTE — Patient Instructions (Signed)
? ? ?  GO TO THE FRONT DESK, PLEASE SCHEDULE YOUR APPOINTMENTS ?Come back for  a physical in 3-4 months  ?
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
## 2021-09-19 ENCOUNTER — Other Ambulatory Visit: Payer: Self-pay | Admitting: Family Medicine

## 2021-09-25 ENCOUNTER — Other Ambulatory Visit: Payer: Self-pay | Admitting: Internal Medicine

## 2021-12-01 ENCOUNTER — Other Ambulatory Visit: Payer: Self-pay | Admitting: Internal Medicine

## 2022-01-06 ENCOUNTER — Encounter: Payer: Self-pay | Admitting: Internal Medicine

## 2022-01-06 ENCOUNTER — Ambulatory Visit: Payer: PRIVATE HEALTH INSURANCE | Admitting: Internal Medicine

## 2022-01-06 VITALS — BP 126/74 | HR 97 | Temp 97.9°F | Resp 16 | Ht 71.0 in | Wt 257.2 lb

## 2022-01-06 DIAGNOSIS — F419 Anxiety disorder, unspecified: Secondary | ICD-10-CM

## 2022-01-06 DIAGNOSIS — Z8249 Family history of ischemic heart disease and other diseases of the circulatory system: Secondary | ICD-10-CM

## 2022-01-06 DIAGNOSIS — Z0000 Encounter for general adult medical examination without abnormal findings: Secondary | ICD-10-CM | POA: Diagnosis not present

## 2022-01-06 DIAGNOSIS — F32A Depression, unspecified: Secondary | ICD-10-CM | POA: Diagnosis not present

## 2022-01-06 NOTE — Assessment & Plan Note (Signed)
Here for CPX Anxiety depression: Sees a psychiatrist online, has also counseling.  Feeling okay. ADD: Has not pursue evaluation FH CAD: Grandfather had an MI at age approximately 85, father at age approximately 60.  No symptoms, last EKG okay.  Plan: Calcium coronary score. FH melanoma: Has seen a dermatologist before, recommend to see them from time to time. RTC 1 year

## 2022-01-06 NOTE — Patient Instructions (Signed)
Vaccines I recommend: Flu shot every fall COVID booster  We will schedule a calcium coronary score.  Expect a phone call.  GO TO THE LAB : Get the blood work     GO TO THE FRONT DESK, PLEASE SCHEDULE YOUR APPOINTMENTS Come back for   a physical exam in 1 year
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
## 2022-01-06 NOTE — Assessment & Plan Note (Signed)
-  Tdap: 2022 -COVID vaccines pros>>cons   - flu shot rec - FH:  MI, DM and  melanoma.  Multiple family members with depression - Diet and exercise : Extensive discussion, has an appointment to see a dietitian. -Labs: CMP, FLP, CBC, A1c

## 2022-01-07 LAB — COMPREHENSIVE METABOLIC PANEL
ALT: 23 IU/L (ref 0–44)
AST: 18 IU/L (ref 0–40)
Albumin/Globulin Ratio: 2.2 (ref 1.2–2.2)
Albumin: 4.9 g/dL (ref 4.1–5.1)
Alkaline Phosphatase: 95 IU/L (ref 44–121)
BUN/Creatinine Ratio: 16 (ref 9–20)
BUN: 13 mg/dL (ref 6–24)
Bilirubin Total: 0.3 mg/dL (ref 0.0–1.2)
CO2: 25 mmol/L (ref 20–29)
Calcium: 9.7 mg/dL (ref 8.7–10.2)
Chloride: 102 mmol/L (ref 96–106)
Creatinine, Ser: 0.83 mg/dL (ref 0.76–1.27)
Globulin, Total: 2.2 g/dL (ref 1.5–4.5)
Glucose: 100 mg/dL — ABNORMAL HIGH (ref 70–99)
Potassium: 4.7 mmol/L (ref 3.5–5.2)
Sodium: 139 mmol/L (ref 134–144)
Total Protein: 7.1 g/dL (ref 6.0–8.5)
eGFR: 113 mL/min/{1.73_m2} (ref >59)

## 2022-01-07 LAB — HEMOGLOBIN A1C
Est. average glucose Bld gHb Est-mCnc: 117 mg/dL
Hgb A1c MFr Bld: 5.7 % — ABNORMAL HIGH (ref 4.8–5.6)

## 2022-01-07 LAB — LIPID PANEL
Chol/HDL Ratio: 5.1 ratio — ABNORMAL HIGH (ref 0.0–5.0)
Cholesterol, Total: 184 mg/dL (ref 100–199)
HDL: 36 mg/dL — ABNORMAL LOW (ref >39)
LDL Chol Calc (NIH): 120 mg/dL — ABNORMAL HIGH (ref 0–99)
Triglycerides: 157 mg/dL — ABNORMAL HIGH (ref 0–149)
VLDL Cholesterol Cal: 28 mg/dL (ref 5–40)

## 2022-01-07 LAB — CBC WITH DIFFERENTIAL/PLATELET
Basophils Absolute: 0 10*3/uL (ref 0.0–0.2)
Basos: 0 %
EOS (ABSOLUTE): 0.3 10*3/uL (ref 0.0–0.4)
Eos: 3 %
Hematocrit: 47 % (ref 37.5–51.0)
Hemoglobin: 15.1 g/dL (ref 13.0–17.7)
Immature Grans (Abs): 0.1 10*3/uL (ref 0.0–0.1)
Immature Granulocytes: 1 %
Lymphocytes Absolute: 1.9 10*3/uL (ref 0.7–3.1)
Lymphs: 18 %
MCH: 28.2 pg (ref 26.6–33.0)
MCHC: 32.1 g/dL (ref 31.5–35.7)
MCV: 88 fL (ref 79–97)
Monocytes Absolute: 0.9 10*3/uL (ref 0.1–0.9)
Monocytes: 9 %
Neutrophils Absolute: 7.7 10*3/uL — ABNORMAL HIGH (ref 1.4–7.0)
Neutrophils: 69 %
Platelets: 286 10*3/uL (ref 150–450)
RBC: 5.36 x10E6/uL (ref 4.14–5.80)
RDW: 12.9 % (ref 11.6–15.4)
WBC: 10.9 10*3/uL — ABNORMAL HIGH (ref 3.4–10.8)

## 2022-02-17 ENCOUNTER — Ambulatory Visit (HOSPITAL_BASED_OUTPATIENT_CLINIC_OR_DEPARTMENT_OTHER)
Admission: RE | Admit: 2022-02-17 | Discharge: 2022-02-17 | Disposition: A | Discharge status: Home / Self Care | Payer: PRIVATE HEALTH INSURANCE | Source: Ambulatory Visit | Attending: Internal Medicine | Admitting: Internal Medicine

## 2022-02-17 DIAGNOSIS — Z8249 Family history of ischemic heart disease and other diseases of the circulatory system: Secondary | ICD-10-CM | POA: Insufficient documentation

## 2022-02-21 MED ORDER — ATORVASTATIN CALCIUM 20 MG PO TABS: 20.0000 mg | ORAL_TABLET | Freq: Every day | ORAL | 0 refills | Status: DC

## 2022-02-21 NOTE — Addendum Note (Signed)
Addended byDamita Dunnings D on: 02/21/2022 08:13 AM   Modules accepted: Orders

## 2022-02-21 NOTE — Addendum Note (Signed)
Addended byDamita Dunnings D on: 02/21/2022 08:14 AM   Modules accepted: Orders

## 2022-05-23 ENCOUNTER — Encounter: Payer: Self-pay | Admitting: Internal Medicine

## 2022-09-05 ENCOUNTER — Ambulatory Visit (HOSPITAL_BASED_OUTPATIENT_CLINIC_OR_DEPARTMENT_OTHER)
Admission: RE | Admit: 2022-09-05 | Discharge: 2022-09-05 | Disposition: A | Discharge status: Home / Self Care | Payer: 59 | Source: Ambulatory Visit | Attending: Internal Medicine | Admitting: Internal Medicine

## 2022-09-05 ENCOUNTER — Ambulatory Visit: Payer: 59 | Admitting: Internal Medicine

## 2022-09-05 VITALS — BP 122/84 | HR 83 | Temp 98.1°F | Resp 18 | Ht 71.0 in | Wt 251.1 lb

## 2022-09-05 DIAGNOSIS — R042 Hemoptysis: Secondary | ICD-10-CM | POA: Diagnosis not present

## 2022-09-05 DIAGNOSIS — J069 Acute upper respiratory infection, unspecified: Secondary | ICD-10-CM | POA: Diagnosis not present

## 2022-09-05 DIAGNOSIS — K4090 Unilateral inguinal hernia, without obstruction or gangrene, not specified as recurrent: Secondary | ICD-10-CM

## 2022-09-05 NOTE — Progress Notes (Unsigned)
Subjective:    Patient ID: Paul Alvarez, male    DOB: Sep 30, 1980, 42 y.o.   MRN: [id number]  DOS:  09/05/2022 Type of visit - description: acute  His daughter got sick with pneumonia and has severe cough.  The patient himself started with symptoms a week ago: Violent cough, thick sputum. He had a fever up to 101. Some difficulty breathing chest congestion/wheezing noted. 5 days ago was prescribed a Z-Pak and albuterol. Reports he is much improved. He did notice few drops of blood in the sputum few times but no frank hemoptysis.  Also, for more than a year has noted some discomfort at the right groin.  Hernia?  Symptoms were worse while he was coughing severely.  Review of Systems See above   Past Medical History:  Diagnosis Date   Allergy    Arthritis    Frequent headaches    GERD (gastroesophageal reflux disease)     Past Surgical History:  Procedure Laterality Date   COSMETIC SURGERY  2003   cyst removal   WISDOM TOOTH EXTRACTION  2002    Current Outpatient Medications  Medication Instructions   albuterol (VENTOLIN HFA) 108 (90 Base) MCG/ACT inhaler 1-2 puffs, Inhalation, Every 6 hours PRN   atorvastatin (LIPITOR) 20 mg, Oral, Daily at bedtime   azithromycin (ZITHROMAX) 250 MG tablet As directed   benzonatate (TESSALON) 100-200 mg, Oral, 3 times daily PRN   buPROPion (WELLBUTRIN XL) 300 mg, Oral, Daily   FLUoxetine (PROZAC) 40 MG capsule TAKE 1 CAPSULE(40 MG) BY MOUTH DAILY   meloxicam (MOBIC) 15 MG tablet TAKE 1 TABLET(15 MG) BY MOUTH DAILY AS NEEDED FOR PAIN   Semaglutide, 1 MG/DOSE, 2 MG/1.5ML SOPN Compounded       Objective:   Physical Exam BP 122/84   Pulse 83   Temp 98.1 F (36.7 C) (Oral)   Resp 18   Ht 5\' 11"  (1.803 m)   Wt 251 lb 2 oz (113.9 kg)   SpO2 96%   BMI 35.02 kg/m  General:   Well developed, NAD, BMI noted.  HEENT:  Normocephalic . Face symmetric, atraumatic. TMs normal.  Throat symmetric not red. Lungs:  + Rhonchi  bilaterally, slightly increased expiratory time.  Normal respiratory effort, no intercostal retractions, no accessory muscle use. Heart: RRR,  no murmur.  Abdomen:  Not distended, soft, non-tender. No rebound or rigidity.   Groins: Left normal, right: Hernia at the suprapubic area. Scrotal contents normal. Skin: Not pale. Not jaundice Lower extremities: no pretibial edema bilaterally  Neurologic:  alert & oriented X3.  Speech normal, gait appropriate for age and unassisted Psych--  Cognition and judgment appear intact.  Cooperative with normal attention span and concentration.  Behavior appropriate. No anxious or depressed appearing.     Assessment     ssessment  New patient 12-2017 GERD Migraines Hay fever, allergies. Covid infex 10/2019, rx conservatively   PLAN Cough, hemoptysis: Likely bronchitis and bronchospasm, no history of asthma, he is much better after he took Zithromax prescribed by a telehealth doctor. Because hemoptysis I will obtain chest x-ray  -- r/or pneumonia, continue Mucinex, hydration, albuterol. Renal back to normal in few days he is to let me know. Hernia: Right-sided hernia, discussed referral versus observation, elective referral.  Will arrange. RTC scheduled for September CPX    9 Here for CPX Anxiety depression: Sees a psychiatrist online, has also counseling.  Feeling okay. ADD: Has not pursue evaluation FH CAD: Grandfather had an MI at age approximately  80, father at age approximately 41.  No symptoms, last EKG okay.  Plan: Calcium coronary score. FH melanoma: Has seen a dermatologist before, recommend to see them from time to time. RTC 1 year

## 2022-09-05 NOTE — Patient Instructions (Addendum)
Continue with good hydration. Robitussin DM or Mucinex DM Use inhaler as needed for cough and chest congestion.  Proceed with x-ray at the first floor.  If not back to normal in a week or 2 please let me know.  We are referring you to general surgery for the diagnosis of a hernia  See you in September

## 2022-09-06 NOTE — Assessment & Plan Note (Signed)
Cough, hemoptysis: Likely bronchitis and bronchospasm, although has no history of asthma, he is much better after he took Zithromax prescribed by a telehealth doctor. Because hemoptysis I will obtain chest x-ray  -- r/or pneumonia, continue Mucinex, hydration, albuterol. If not back to normal in few days he is to let me know. Hernia: Right-sided hernia, discussed referral versus observation, elective referral.  Will arrange. RTC scheduled for September CPX

## 2022-10-26 ENCOUNTER — Encounter: Payer: Self-pay | Admitting: Internal Medicine

## 2022-10-26 DIAGNOSIS — F988 Other specified behavioral and emotional disorders with onset usually occurring in childhood and adolescence: Secondary | ICD-10-CM

## 2022-10-27 NOTE — Addendum Note (Signed)
Addended byConrad Patterson D on: 10/27/2022 02:45 PM   Modules accepted: Orders

## 2022-10-27 NOTE — Telephone Encounter (Signed)
Needs referral to the [facility]. Dx ADD Let the patient know if he has to reach out to them.

## 2022-10-27 NOTE — Telephone Encounter (Signed)
Referral placed.

## 2022-12-14 ENCOUNTER — Encounter (INDEPENDENT_AMBULATORY_CARE_PROVIDER_SITE_OTHER): Payer: Self-pay

## 2023-01-11 ENCOUNTER — Other Ambulatory Visit: Payer: Self-pay

## 2023-01-12 ENCOUNTER — Encounter: Payer: PRIVATE HEALTH INSURANCE | Admitting: Internal Medicine
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
## 2023-03-27 ENCOUNTER — Ambulatory Visit (INDEPENDENT_AMBULATORY_CARE_PROVIDER_SITE_OTHER): Payer: 59 | Admitting: Internal Medicine

## 2023-03-27 ENCOUNTER — Encounter: Payer: Self-pay | Admitting: Internal Medicine

## 2023-03-27 VITALS — BP 122/84 | HR 84 | Temp 97.8°F | Resp 16 | Ht 71.0 in | Wt 240.4 lb

## 2023-03-27 DIAGNOSIS — E785 Hyperlipidemia, unspecified: Secondary | ICD-10-CM

## 2023-03-27 DIAGNOSIS — F909 Attention-deficit hyperactivity disorder, unspecified type: Secondary | ICD-10-CM

## 2023-03-27 DIAGNOSIS — R5383 Other fatigue: Secondary | ICD-10-CM

## 2023-03-27 DIAGNOSIS — R739 Hyperglycemia, unspecified: Secondary | ICD-10-CM | POA: Diagnosis not present

## 2023-03-27 DIAGNOSIS — Z0000 Encounter for general adult medical examination without abnormal findings: Secondary | ICD-10-CM

## 2023-03-27 DIAGNOSIS — Z0001 Encounter for general adult medical examination with abnormal findings: Secondary | ICD-10-CM | POA: Diagnosis not present

## 2023-03-27 DIAGNOSIS — E291 Testicular hypofunction: Secondary | ICD-10-CM

## 2023-03-27 DIAGNOSIS — Z808 Family history of malignant neoplasm of other organs or systems: Secondary | ICD-10-CM

## 2023-03-27 NOTE — Progress Notes (Unsigned)
Subjective:    Patient ID: Paul Alvarez, male    DOB: 1980/05/05, 42 y.o.   MRN: [id number]  DOS:  03/27/2023 Type of visit - description: CPX  Here for CPX. Medication list reviewed. He has some concerns. Mostly his energy, is not the same as few years ago.  Denies chest pain or difficulty breathing.  "Tired early in the morning, once I get going I feel better". Also, libido is normal, some difficulty with erections lately.  Denies any particular stress.  Review of Systems  Other than above, a 14 point review of systems is negative    Past Medical History:  Diagnosis Date   ADHD    Allergy    Arthritis    Frequent headaches    GERD (gastroesophageal reflux disease)     Past Surgical History:  Procedure Laterality Date   COSMETIC SURGERY  2003   cyst removal   WISDOM TOOTH EXTRACTION  2002    Social History   Socioeconomic History   Marital status: Married    Spouse name: Not on file   Number of children: 0   Years of education: Not on file   Highest education level: Bachelor's degree (e.g., BA, AB, BS)  Occupational History   Occupation: Surveyor, quantity  Tobacco Use   Smoking status: Former   Smokeless tobacco: Never   Tobacco comments:    quit 10/2017, no vaping   Substance and Sexual Activity   Alcohol use: Yes    Comment: rarely   Drug use: Not Currently    Types: Marijuana   Sexual activity: Yes  Other Topics Concern   Not on file  Social History Narrative   Married 2019   2 step children   Social Determinants of Health   Financial Resource Strain: Low Risk  (09/05/2022)   Overall Financial Resource Strain (CARDIA)    Difficulty of Paying Living Expenses: Not very hard  Food Insecurity: No Food Insecurity (09/05/2022)   Hunger Vital Sign    Worried About Running Out of Food in the Last Year: Never true    Ran Out of Food in the Last Year: Never true  Transportation Needs: No Transportation Needs (09/05/2022)   PRAPARE -  Administrator, Civil Service (Medical): No    Lack of Transportation (Non-Medical): No  Physical Activity: Insufficiently Active (09/05/2022)   Exercise Vital Sign    Days of Exercise per Week: 3 days    Minutes of Exercise per Session: 30 min  Stress: No Stress Concern Present (09/05/2022)   [employer] of Occupational Health - Occupational Stress Questionnaire    Feeling of Stress : Not at all  Social Connections: Socially Integrated (09/05/2022)   Social Connection and Isolation Panel [NHANES]    Frequency of Communication with Friends and Family: More than three times a week    Frequency of Social Gatherings with Friends and Family: Once a week    Attends Religious Services: More than 4 times per year    Active Member of [employer] or Organizations: Yes    Attends Engineer, structural: More than 4 times per year    Marital Status: Married  Catering manager Violence: Not on file     Current Outpatient Medications  Medication Instructions   buPROPion (WELLBUTRIN XL) 300 mg, Oral, Daily   Methylphenidate HCl ER 54 mg, Oral, Daily   Semaglutide, 1 MG/DOSE, 2 MG/1.5ML SOPN Compounded        Objective:   Physical  Exam BP 122/84   Pulse 84   Temp 97.8 F (36.6 C) (Oral)   Resp 16   Ht 5\' 11"  (1.803 m)   Wt 240 lb 6 oz (109 kg)   SpO2 98%   BMI 33.53 kg/m  General: Well developed, NAD, BMI noted Neck: No  thyromegaly  HEENT:  Normocephalic . Face symmetric, atraumatic Lungs:  CTA B Normal respiratory effort, no intercostal retractions, no accessory muscle use. Heart: RRR,  no murmur.  Abdomen:  Not distended, soft, non-tender. No rebound or rigidity.   Lower extremities: no pretibial edema bilaterally  Skin: Exposed areas without rash. Not pale. Not jaundice Neurologic:  alert & oriented X3.  Speech normal, gait appropriate for age and unassisted Strength symmetric and appropriate for age.  Psych: Cognition and judgment appear intact.   Cooperative with normal attention span and concentration.  Behavior appropriate. No anxious or depressed appearing.     Assessment    ASSESSMENT   High cholesterol GERD Migraines Anxiety-depression: Per psychiatry on-lne ADHD saw [facility], started med 2024  Hay fever, allergies.    PLAN Here for CPX Tdap: 2022 Vaccines are recommended: Flu and COVID vaccines , pros>>cons - FH:  MI, DM and  melanoma.  Multiple family members with depression -Labs: CMP FLP CBC A1c total testosterone TSH -Diet exercise: Doing much better with diet, on Wegovy, was as high as 270 pounds.  Today 240. Obesity: On Wegovy, lost a significant amount of weight. FH melanoma: Recommend dermatology referral. Dyslipidemia:  +FH CAD,  coronary calcium score 02/17/2022 >>  2.4, percentile 77, was recommended atorvastatin 20 mg, took it for few weeks (?) then stopped.  Will recheck FLP, further better results. ADHD: Evaluated by the [facility], on meds .  Improved. Anxiety depression: Stopped fluoxetine, on Wellbutrin only, feeling well. Per psych Decreased energy: Will check general labs, interested on check his testosterone, will do.  Epworth score negative. ED: Checking a testosterone levels RTC 4 months

## 2023-03-27 NOTE — Patient Instructions (Signed)
    Stop at the front desk. Arrange for fasting blood work early in the morning at your earliest convenience Arrange a follow-up with me in 4 months.

## 2023-03-28 ENCOUNTER — Other Ambulatory Visit (INDEPENDENT_AMBULATORY_CARE_PROVIDER_SITE_OTHER): Payer: 59

## 2023-03-28 ENCOUNTER — Encounter: Payer: Self-pay | Admitting: Internal Medicine

## 2023-03-28 DIAGNOSIS — R739 Hyperglycemia, unspecified: Secondary | ICD-10-CM

## 2023-03-28 DIAGNOSIS — R5383 Other fatigue: Secondary | ICD-10-CM | POA: Diagnosis not present

## 2023-03-28 DIAGNOSIS — E785 Hyperlipidemia, unspecified: Secondary | ICD-10-CM

## 2023-03-28 DIAGNOSIS — E291 Testicular hypofunction: Secondary | ICD-10-CM

## 2023-03-28 NOTE — Assessment & Plan Note (Signed)
Here for CPX Obesity: On Wegovy, lost a significant amount of weight. FH melanoma: Recommend dermatology referral. Dyslipidemia:  +FH CAD,  coronary calcium score 02/17/2022 >>  2.4, percentile 77, was recommended atorvastatin 20 mg, took it for few weeks (?) then stopped.  Will recheck FLP, further better results. ADHD: Evaluated by the [facility], on meds .  Improved. Anxiety depression: Stopped fluoxetine, on Wellbutrin only, feeling well. Per psych Decreased energy: Will check general labs, interested on check his testosterone, will do.  Epworth score negative. ED: Checking a testosterone levels RTC 4 months

## 2023-03-28 NOTE — Assessment & Plan Note (Signed)
Here for CPX Tdap: 2022 Vaccines are recommended: Flu and COVID vaccines , pros>>cons - FH:  MI, DM and  melanoma.  Multiple family members with depression -Labs: CMP FLP CBC A1c total testosterone TSH -Diet exercise: Doing much better with diet, on Wegovy, was as high as 270 pounds.  Today 240.

## 2023-03-29 LAB — CBC WITH DIFFERENTIAL/PLATELET
Basophils Absolute: 0.1 10*3/uL (ref 0.0–0.2)
Basos: 1 %
EOS (ABSOLUTE): 0.2 10*3/uL (ref 0.0–0.4)
Eos: 3 %
Hematocrit: 49 % (ref 37.5–51.0)
Hemoglobin: 15.8 g/dL (ref 13.0–17.7)
Immature Grans (Abs): 0 10*3/uL (ref 0.0–0.1)
Immature Granulocytes: 0 %
Lymphocytes Absolute: 1.8 10*3/uL (ref 0.7–3.1)
Lymphs: 26 %
MCH: 28.3 pg (ref 26.6–33.0)
MCHC: 32.2 g/dL (ref 31.5–35.7)
MCV: 88 fL (ref 79–97)
Monocytes Absolute: 0.8 10*3/uL (ref 0.1–0.9)
Monocytes: 11 %
Neutrophils Absolute: 3.9 10*3/uL (ref 1.4–7.0)
Neutrophils: 59 %
Platelets: 334 10*3/uL (ref 150–450)
RBC: 5.58 x10E6/uL (ref 4.14–5.80)
RDW: 12.9 % (ref 11.6–15.4)
WBC: 6.8 10*3/uL (ref 3.4–10.8)

## 2023-03-29 LAB — LIPID PANEL
Chol/HDL Ratio: 5 {ratio} (ref 0.0–5.0)
Cholesterol, Total: 166 mg/dL (ref 100–199)
HDL: 33 mg/dL — ABNORMAL LOW (ref >39)
LDL Chol Calc (NIH): 115 mg/dL — ABNORMAL HIGH (ref 0–99)
Triglycerides: 94 mg/dL (ref 0–149)
VLDL Cholesterol Cal: 18 mg/dL (ref 5–40)

## 2023-03-29 LAB — COMPREHENSIVE METABOLIC PANEL
ALT: 24 [IU]/L (ref 0–44)
AST: 26 [IU]/L (ref 0–40)
Albumin: 4.6 g/dL (ref 4.1–5.1)
Alkaline Phosphatase: 97 [IU]/L (ref 44–121)
BUN/Creatinine Ratio: 12 (ref 9–20)
BUN: 11 mg/dL (ref 6–24)
Bilirubin Total: 0.4 mg/dL (ref 0.0–1.2)
CO2: 23 mmol/L (ref 20–29)
Calcium: 9.8 mg/dL (ref 8.7–10.2)
Chloride: 101 mmol/L (ref 96–106)
Creatinine, Ser: 0.9 mg/dL (ref 0.76–1.27)
Globulin, Total: 2.7 g/dL (ref 1.5–4.5)
Glucose: 89 mg/dL (ref 70–99)
Potassium: 5.2 mmol/L (ref 3.5–5.2)
Sodium: 138 mmol/L (ref 134–144)
Total Protein: 7.3 g/dL (ref 6.0–8.5)
eGFR: 110 mL/min/{1.73_m2} (ref >59)

## 2023-03-29 LAB — TESTOSTERONE: Testosterone: 299 ng/dL (ref 264–916)

## 2023-03-29 LAB — HEMOGLOBIN A1C
Est. average glucose Bld gHb Est-mCnc: 108 mg/dL
Hgb A1c MFr Bld: 5.4 % (ref 4.8–5.6)

## 2023-03-29 LAB — TSH: TSH: 1.29 u[IU]/mL (ref 0.450–4.500)

## 2023-04-04 ENCOUNTER — Encounter: Payer: Self-pay | Admitting: Internal Medicine

## 2023-04-04 MED ORDER — ATORVASTATIN CALCIUM 20 MG PO TABS: 20.0000 mg | ORAL_TABLET | Freq: Every day | ORAL | 0 refills | Status: DC

## 2023-04-04 NOTE — Addendum Note (Signed)
Addended byConrad [place] D on: 04/04/2023 10:09 AM   Modules accepted: Orders

## 2023-04-05 MED ORDER — ATORVASTATIN CALCIUM 20 MG PO TABS: 20.0000 mg | ORAL_TABLET | Freq: Every day | ORAL | 0 refills | Status: DC

## 2023-06-03 ENCOUNTER — Encounter: Payer: Self-pay | Admitting: Internal Medicine

## 2023-06-30 ENCOUNTER — Other Ambulatory Visit: Payer: Self-pay | Admitting: Internal Medicine

## 2023-07-27 ENCOUNTER — Ambulatory Visit: Payer: 59 | Admitting: Internal Medicine

## 2023-08-03 ENCOUNTER — Ambulatory Visit: Admitting: Internal Medicine

## 2023-08-24 ENCOUNTER — Ambulatory Visit: Admitting: Internal Medicine

## 2023-09-21 ENCOUNTER — Ambulatory Visit: Admitting: Internal Medicine
